# Patient Record
Sex: Female | Born: 1975 | Race: Black or African American | Hispanic: No | Marital: Single | Smoking: Never smoker
Health system: Southern US, Community
[De-identification: ages and names within clinical notes are randomized; demographics above are authoritative.]

## PROBLEM LIST (undated history)

## (undated) DIAGNOSIS — D509 Iron deficiency anemia, unspecified: Secondary | ICD-10-CM

## (undated) DIAGNOSIS — L309 Dermatitis, unspecified: Secondary | ICD-10-CM

## (undated) DIAGNOSIS — J40 Bronchitis, not specified as acute or chronic: Secondary | ICD-10-CM

## (undated) DIAGNOSIS — N921 Excessive and frequent menstruation with irregular cycle: Secondary | ICD-10-CM

## (undated) HISTORY — DX: Iron deficiency anemia, unspecified: D50.9

## (undated) HISTORY — PX: OTHER SURGICAL HISTORY: SHX169

## (undated) HISTORY — DX: Bronchitis, not specified as acute or chronic: J40

---

## 1995-11-29 HISTORY — PX: TUBAL LIGATION: SHX77

## 2011-04-14 ENCOUNTER — Emergency Department (HOSPITAL_COMMUNITY)
Admission: EM | Admit: 2011-04-14 | Discharge: 2011-04-15 | Disposition: A | Discharge status: Home / Self Care | Payer: Medicaid - Out of State | Attending: Emergency Medicine | Admitting: Emergency Medicine

## 2011-04-14 DIAGNOSIS — X58XXXA Exposure to other specified factors, initial encounter: Secondary | ICD-10-CM | POA: Insufficient documentation

## 2011-04-14 DIAGNOSIS — H10219 Acute toxic conjunctivitis, unspecified eye: Secondary | ICD-10-CM | POA: Insufficient documentation

## 2011-04-14 DIAGNOSIS — S0500XA Injury of conjunctiva and corneal abrasion without foreign body, unspecified eye, initial encounter: Secondary | ICD-10-CM | POA: Insufficient documentation

## 2011-07-11 ENCOUNTER — Ambulatory Visit (INDEPENDENT_AMBULATORY_CARE_PROVIDER_SITE_OTHER): Payer: Medicaid Other | Admitting: Family Medicine

## 2011-07-11 ENCOUNTER — Encounter: Payer: Self-pay | Admitting: Family Medicine

## 2011-07-11 VITALS — BP 118/80 | HR 62 | Resp 16 | Ht 64.5 in | Wt 177.1 lb

## 2011-07-11 DIAGNOSIS — Z720 Tobacco use: Secondary | ICD-10-CM

## 2011-07-11 DIAGNOSIS — E669 Obesity, unspecified: Secondary | ICD-10-CM | POA: Insufficient documentation

## 2011-07-11 DIAGNOSIS — Z3202 Encounter for pregnancy test, result negative: Secondary | ICD-10-CM

## 2011-07-11 DIAGNOSIS — Z309 Encounter for contraceptive management, unspecified: Secondary | ICD-10-CM

## 2011-07-11 DIAGNOSIS — D509 Iron deficiency anemia, unspecified: Secondary | ICD-10-CM

## 2011-07-11 DIAGNOSIS — E663 Overweight: Secondary | ICD-10-CM

## 2011-07-11 DIAGNOSIS — Z3049 Encounter for surveillance of other contraceptives: Secondary | ICD-10-CM

## 2011-07-11 DIAGNOSIS — F17200 Nicotine dependence, unspecified, uncomplicated: Secondary | ICD-10-CM

## 2011-07-11 MED ORDER — MEDROXYPROGESTERONE ACETATE 150 MG/ML IM SUSP
150.0000 mg | Freq: Once | INTRAMUSCULAR | Status: AC
  Administered 2011-07-11: 150 mg via INTRAMUSCULAR

## 2011-07-11 MED ORDER — BUPROPION HCL ER (SR) 150 MG PO TB12: 150.0000 mg | ORAL_TABLET | Freq: Two times a day (BID) | ORAL | Status: DC

## 2011-07-11 MED ORDER — FERROUS SULFATE 325 (65 FE) MG PO TBEC: 325.0000 mg | DELAYED_RELEASE_TABLET | Freq: Three times a day (TID) | ORAL | Status: DC

## 2011-07-11 NOTE — Progress Notes (Signed)
  Subjective:    Patient ID: Shelley Vargas, female    DOB: 01-31-1976, 35 y.o.   MRN: [id number]  HPI patient here to establish care. Medications and history was reviewed with patient. Her main concerns today are smoking and weight gain. She also needs a refill on her vitamins and Depo-Provera shot.  Smoking- smokes 1.5 ppd, [place] lights, started 4 months, feels stressed since she moved back to [place] from [place]. She has no prior history of smoking. 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 She would like to start medications to help smoking. She states she is ready to quit today. In the past she's tried the electronic cigarette.  Weight gain- 185lbs heaviest weight , feels fatigued when she walks, does not exercise on daily basis, feels like she is gaining weight since she has moved back to [place]. She does not pay particular attention to her diet.  Iron def anemia- started on supplement , levels were last  checked in May 2012.  Vitamin Deficiency- previously on q weekly supplment    Pt on Depo-Medrol for birth control PAP smear due in October LMP- 07/04/11  Previous PCP in [facility]-  Review of Systems GEN- +fatigue, denies fever, weight loss,weakness, recent illness CVS- denies chest pain, palpitations RESP- denies SOB, cough, wheeze MSK- denies joint pain, muscle aches, injury        Objective:   Physical Exam GEN- NAD, alert and oriented x3, overweight HEENT- PERRL, EOMI, non injected sclera, slightlt pale conjunctiva, MMM, oropharynx clear, no oral lesions Neck- Supple, no thryomegaly CVS- RRR, no murmur RESP-CTAB EXT- No edema, wellhealed scar on left lower leg  Pulses- Radial, DP- 2+        Assessment & Plan:

## 2011-07-11 NOTE — Assessment & Plan Note (Signed)
Discussed BMI of 29. Discussed healthy eating habits such as decreasing carbohydrates and fatty foods. Patient should also increase her exercise routine. See instructions. I advised she has not needed diet pill

## 2011-07-11 NOTE — Assessment & Plan Note (Addendum)
The patient is out of her on her supplement. Will give her a refill on her on tablets 325 mg 3 times a day. Records will be obtained from her previous PCP as well as the last set of labs. We also discussed other interventions such as the use of gum, carrots, and keeping herself busy.

## 2011-07-11 NOTE — Assessment & Plan Note (Signed)
Patient will like to start Zyban. Started 150 mg daily then increase to twice daily. Patient has her quit date for this week.

## 2011-07-11 NOTE — Patient Instructions (Addendum)
Restart your iron tablets For smoking start the Wellbutrin 150mg  once a day for 3 days, then take twice a day, take one dose in the morning and the second dose 8 hours later.  For your weight- Start a walking program 30 minutes a day at least 3 days a week Watch the fatty foods and carbohydrates- low carbs Depo shot due in 3 months-  We will get your medical records Schedule a visit for a PAP Smear in October

## 2011-07-24 ENCOUNTER — Encounter: Payer: Self-pay | Admitting: Family Medicine

## 2011-07-24 NOTE — Progress Notes (Signed)
  Subjective:    Patient ID: Shelley Vargas, female    DOB: [birth year], 35 y.o.   MRN: [id number]  HPI    Review of Systems     Objective:   Physical Exam        Assessment & Plan:   Received PCP records Labs 01/27/11  TSH- wnl TC 211, HDL 50 TG 133 LDL 134  BUN/CR- wnl  Vit D level- 8  Needs replacement if not done

## 2011-08-26 ENCOUNTER — Encounter: Payer: Self-pay | Admitting: Family Medicine

## 2011-08-26 ENCOUNTER — Ambulatory Visit: Payer: Medicaid Other | Admitting: Family Medicine

## 2011-10-11 ENCOUNTER — Ambulatory Visit: Payer: PRIVATE HEALTH INSURANCE

## 2012-02-17 ENCOUNTER — Emergency Department (HOSPITAL_COMMUNITY)
Admission: EM | Admit: 2012-02-17 | Discharge: 2012-02-17 | Disposition: A | Discharge status: Home / Self Care | Payer: Medicaid Other | Attending: Emergency Medicine | Admitting: Emergency Medicine

## 2012-02-17 ENCOUNTER — Encounter (HOSPITAL_COMMUNITY): Payer: Self-pay | Admitting: *Deleted

## 2012-02-17 DIAGNOSIS — Z207 Contact with and (suspected) exposure to pediculosis, acariasis and other infestations: Secondary | ICD-10-CM

## 2012-02-17 DIAGNOSIS — B86 Scabies: Secondary | ICD-10-CM | POA: Insufficient documentation

## 2012-02-17 DIAGNOSIS — Z2089 Contact with and (suspected) exposure to other communicable diseases: Secondary | ICD-10-CM

## 2012-02-17 DIAGNOSIS — Z760 Encounter for issue of repeat prescription: Secondary | ICD-10-CM | POA: Insufficient documentation

## 2012-02-17 DIAGNOSIS — D509 Iron deficiency anemia, unspecified: Secondary | ICD-10-CM | POA: Insufficient documentation

## 2012-02-17 HISTORY — DX: Dermatitis, unspecified: L30.9

## 2012-02-17 MED ORDER — PERMETHRIN 5 % EX CREA: TOPICAL_CREAM | CUTANEOUS | Status: AC

## 2012-02-17 MED ORDER — TRIAMCINOLONE ACETONIDE 0.1 % EX CREA: TOPICAL_CREAM | Freq: Two times a day (BID) | CUTANEOUS | Status: DC

## 2012-02-17 NOTE — ED Notes (Signed)
Rash, exposed to scabies.

## 2012-02-17 NOTE — ED Notes (Signed)
See EDP notes 

## 2012-02-18 NOTE — ED Provider Notes (Signed)
Medical screening examination/treatment/procedure(s) were performed by non-physician practitioner and as supervising physician I was immediately available for consultation/collaboration.  Brealynn Contino, MD 02/18/12 1616 

## 2012-02-18 NOTE — ED Provider Notes (Signed)
History     CSN: [id number]  Arrival date & time 02/17/12  1933   None     Chief Complaint  Patient presents with  . Rash    (Consider location/radiation/quality/duration/timing/severity/associated sxs/prior treatment) HPI Comments: Patient presents for treatment of scabies exposure.  She presents with 2 siblings and a nephew all of whom state at arrival to his home last weekend, only to discover that the relatives were diagnosed with scabies earlier this week.  She does have chronic patches of eczema, but no lesions or rash suspicious for scabies, but is desirous of treatment given his exposure.  She denies any symptoms at this time, specifically no fevers or chills, no rash(except for 2 small eczema-like patches on her lower back) she has recently moved here from [place] and has not established care with the family physician.  She also asks for refill of her triamcinolone for when necessary use for her eczema.  The history is provided by the patient.    Past Medical History  Diagnosis Date  . Bronchitis   . Iron deficiency anemia   . Eczema     Past Surgical History  Procedure Date  . Rods in left leg     Family History  Problem Relation Age of Onset  . Hypertension Mother   . Hypertension Father     History  Substance Use Topics  . Smoking status: Never Smoker   . Smokeless tobacco: Not on file  . Alcohol Use: No    OB History    Grav Para Term Preterm Abortions TAB SAB Ect Mult Living                  Review of Systems  Constitutional: Negative for fever.  HENT: Negative.  Negative for sore throat.   Eyes: Negative.   Respiratory: Negative for cough and shortness of breath.   Cardiovascular: Negative for chest pain.  Gastrointestinal: Negative for nausea and abdominal pain.  Genitourinary: Negative.   Musculoskeletal: Negative for joint swelling and arthralgias.  Skin: Positive for rash. Negative for wound.  Neurological: Negative for weakness.    Hematological: Negative.   Psychiatric/Behavioral: Negative.     Allergies  Penicillins  Home Medications   Current Outpatient Rx  Name Route Sig Dispense Refill  . BUPROPION HCL ER (SR) 150 MG PO TB12 Oral Take 1 tablet (150 mg total) by mouth 2 (two) times daily. 60 tablet 2  . FERROUS SULFATE 325 (65 FE) MG PO TBEC Oral Take 1 tablet (325 mg total) by mouth 3 (three) times daily with meals. 90 tablet 6  . PERMETHRIN 5 % EX CREA  Apply head to toes,  Avoiding eyes and leave on for 10 hours, then shower.  You may need to repeat this treatment if not better in 10 days. 60 g 1  . TRIAMCINOLONE ACETONIDE 0.1 % EX CREA Topical Apply topically 2 (two) times daily. 30 g 0    BP 133/87  Pulse 80  Temp(Src) 98 F (36.7 C) (Oral)  Resp 20  Ht 5\' 1"  (1.549 m)  Wt 185 lb (83.915 kg)  BMI 34.96 kg/m2  SpO2 100%  Physical Exam  Nursing note and vitals reviewed. Constitutional: She is oriented to person, place, and time. She appears well-developed and well-nourished.  HENT:  Head: Normocephalic and atraumatic.  Cardiovascular: Normal rate.   Pulmonary/Chest: Effort normal.  Musculoskeletal: Normal range of motion.  Neurological: She is alert and oriented to person, place, and time.  Skin: Skin is  warm and dry.       Several small macular patches on lower back which appear dry with flaking edges, consistent with eczema.  Skin is otherwise without rash or lesion.  Psychiatric: She has a normal mood and affect.    ED Course  Procedures (including critical care time)  Labs Reviewed - No data to display No results found.   1. Scabies exposure   2. Medication refill       MDM  Patient prescribed permethrin given exposure to scabies.  Also given a refill prescription on her triamcinolone for when necessary use.        Candis Musa, PA 02/18/12 1553

## 2012-05-16 ENCOUNTER — Ambulatory Visit (INDEPENDENT_AMBULATORY_CARE_PROVIDER_SITE_OTHER): Payer: Medicaid Other

## 2012-05-16 VITALS — BP 126/76 | Wt 182.0 lb

## 2012-05-16 DIAGNOSIS — Z111 Encounter for screening for respiratory tuberculosis: Secondary | ICD-10-CM

## 2012-05-16 NOTE — Progress Notes (Signed)
Pt in for PPD placement.  PPD placed in left forearm.  No sign or symptom of adverse reaction.  Pt instructed to return on Friday 05/18/2012 to have PPD read.

## 2012-05-18 LAB — TB SKIN TEST

## 2012-08-13 ENCOUNTER — Ambulatory Visit (INDEPENDENT_AMBULATORY_CARE_PROVIDER_SITE_OTHER): Payer: Medicaid Other

## 2012-08-13 VITALS — BP 124/72 | Wt 183.0 lb

## 2012-08-13 DIAGNOSIS — Z111 Encounter for screening for respiratory tuberculosis: Secondary | ICD-10-CM

## 2012-08-14 NOTE — Progress Notes (Signed)
Patient in for PPD placement.  Shot given in left forearm.  No sign or symptom of adverse reaction.  No voiced complaints.  Patient to return on Wednesday 9/18 to have PPD read.

## 2012-08-15 ENCOUNTER — Telehealth: Payer: Self-pay | Admitting: Family Medicine

## 2012-08-28 ENCOUNTER — Ambulatory Visit (INDEPENDENT_AMBULATORY_CARE_PROVIDER_SITE_OTHER): Payer: Medicaid Other | Admitting: Family Medicine

## 2012-08-28 ENCOUNTER — Encounter: Payer: Self-pay | Admitting: Family Medicine

## 2012-08-28 ENCOUNTER — Other Ambulatory Visit (HOSPITAL_COMMUNITY)
Admission: RE | Admit: 2012-08-28 | Discharge: 2012-08-28 | Disposition: A | Discharge status: Home / Self Care | Payer: Medicaid Other | Source: Ambulatory Visit | Attending: Family Medicine | Admitting: Family Medicine

## 2012-08-28 VITALS — BP 124/80 | HR 65 | Resp 15

## 2012-08-28 DIAGNOSIS — N760 Acute vaginitis: Secondary | ICD-10-CM | POA: Insufficient documentation

## 2012-08-28 DIAGNOSIS — N390 Urinary tract infection, site not specified: Secondary | ICD-10-CM

## 2012-08-28 DIAGNOSIS — Z113 Encounter for screening for infections with a predominantly sexual mode of transmission: Secondary | ICD-10-CM | POA: Insufficient documentation

## 2012-08-28 DIAGNOSIS — L259 Unspecified contact dermatitis, unspecified cause: Secondary | ICD-10-CM

## 2012-08-28 DIAGNOSIS — L309 Dermatitis, unspecified: Secondary | ICD-10-CM | POA: Insufficient documentation

## 2012-08-28 LAB — POCT URINALYSIS DIPSTICK
Protein, UA: NEGATIVE
Spec Grav, UA: 1.02
Urobilinogen, UA: 1
pH, UA: 8

## 2012-08-28 MED ORDER — TRIAMCINOLONE ACETONIDE 0.1 % EX CREA: TOPICAL_CREAM | Freq: Two times a day (BID) | CUTANEOUS | Status: DC

## 2012-08-28 MED ORDER — FLUCONAZOLE 150 MG PO TABS: 150.0000 mg | ORAL_TABLET | Freq: Once | ORAL | Status: AC

## 2012-08-28 NOTE — Progress Notes (Signed)
  Subjective:    Patient ID: Shelley Vargas, female    DOB: Apr 07, 1976, 36 y.o.   MRN: [id number]  HPI Patient will schedule for his physical exam however arrived 45 minutes late. She complained of vaginal irritation for the past 2 weeks. She denies any significant vaginal discharge. She denies dysuria or abdominal pain, fever. She has one sexual partner. She states that she used multiple different things are the past couple weeks that could contribute to her irritation. She used a vaginal spray, new soap, body wash in the vaginal area. She also used hand wash as she left her soap at work one day in the vaginal area. Her last menstrual period was 2 weeks ago and is regular She also noted after using handrail at work the past few month she has a rash that popped up on her hands which is itchy in nature. She denies any pustules or drainage Review of Systems - per above   GEN- denies fatigue, fever, weight loss,weakness, recent illness ABD- denies N/V, change in stools, abd pain GU- denies dysuria, hematuria, dribbling, incontinence, denies vaginal discharge       Objective:   Physical Exam GEN- NAD, alert and oriented x3  GU- normal external genitalia, vaginal mucosa mild irritation, cervix visualized no growth, no blood form os, + thick large amount of white discharge, no CMT, no ovarian masses, uterus normal size Skin- eczematous rash on left palm and wrist        Assessment & Plan:

## 2012-08-28 NOTE — Assessment & Plan Note (Signed)
Small area of dermatitis, will try TAC to skin BID, avoid gel washes

## 2012-08-28 NOTE — Addendum Note (Signed)
Addended by: Abner Greenspan on: 08/28/2012 12:48 PM   Modules accepted: Orders

## 2012-08-28 NOTE — Patient Instructions (Signed)
Take the fungal tablet Use the cream twice a day on hands and moisturize We will call with results Reschedule your PAP Smear

## 2012-08-28 NOTE — Assessment & Plan Note (Addendum)
Will give diflucan today, UA show signs of infection but she is asymptomatic therefore will hold on treatment of cystitis Cultures sent

## 2012-08-30 ENCOUNTER — Telehealth: Payer: Self-pay | Admitting: Family Medicine

## 2012-08-30 MED ORDER — CEPHALEXIN 500 MG PO CAPS: 500.0000 mg | ORAL_CAPSULE | Freq: Two times a day (BID) | ORAL | Status: AC

## 2012-08-30 MED ORDER — CLOTRIMAZOLE 1 % EX CREA: TOPICAL_CREAM | Freq: Two times a day (BID) | CUTANEOUS | Status: DC

## 2012-08-30 MED ORDER — HYDROCORTISONE 1 % EX CREA: TOPICAL_CREAM | CUTANEOUS | Status: DC

## 2012-08-30 NOTE — Telephone Encounter (Signed)
Message copied by Salley Scarlet on Thu Aug 30, 2012  4:14 PM ------      Message from: Abner Greenspan      Created: Thu Aug 30, 2012  4:13 PM       Still having itching and irritation. Wants something sent in to her pharmacy

## 2012-08-30 NOTE — Telephone Encounter (Signed)
Cultures negative, has mild dysuria now with irrtation Treat for uncomplicated cystitis and vaginitis external

## 2013-01-03 ENCOUNTER — Emergency Department (HOSPITAL_COMMUNITY)
Admission: EM | Admit: 2013-01-03 | Discharge: 2013-01-03 | Disposition: A | Discharge status: Home / Self Care | Payer: Medicaid Other | Attending: Emergency Medicine | Admitting: Emergency Medicine

## 2013-01-03 ENCOUNTER — Encounter (HOSPITAL_COMMUNITY): Payer: Self-pay

## 2013-01-03 ENCOUNTER — Emergency Department (HOSPITAL_COMMUNITY): Payer: Medicaid Other

## 2013-01-03 DIAGNOSIS — W230XXA Caught, crushed, jammed, or pinched between moving objects, initial encounter: Secondary | ICD-10-CM | POA: Insufficient documentation

## 2013-01-03 DIAGNOSIS — Z8709 Personal history of other diseases of the respiratory system: Secondary | ICD-10-CM | POA: Insufficient documentation

## 2013-01-03 DIAGNOSIS — Z872 Personal history of diseases of the skin and subcutaneous tissue: Secondary | ICD-10-CM | POA: Insufficient documentation

## 2013-01-03 DIAGNOSIS — Y9289 Other specified places as the place of occurrence of the external cause: Secondary | ICD-10-CM | POA: Insufficient documentation

## 2013-01-03 DIAGNOSIS — S9030XA Contusion of unspecified foot, initial encounter: Secondary | ICD-10-CM | POA: Insufficient documentation

## 2013-01-03 DIAGNOSIS — Z9889 Other specified postprocedural states: Secondary | ICD-10-CM | POA: Insufficient documentation

## 2013-01-03 DIAGNOSIS — S9032XA Contusion of left foot, initial encounter: Secondary | ICD-10-CM

## 2013-01-03 DIAGNOSIS — Z79899 Other long term (current) drug therapy: Secondary | ICD-10-CM | POA: Insufficient documentation

## 2013-01-03 DIAGNOSIS — Y939 Activity, unspecified: Secondary | ICD-10-CM | POA: Insufficient documentation

## 2013-01-03 DIAGNOSIS — Z862 Personal history of diseases of the blood and blood-forming organs and certain disorders involving the immune mechanism: Secondary | ICD-10-CM | POA: Insufficient documentation

## 2013-01-03 MED ORDER — IBUPROFEN 400 MG PO TABS
400.0000 mg | ORAL_TABLET | Freq: Once | ORAL | Status: AC
  Administered 2013-01-03: 400 mg via ORAL
  Filled 2013-01-03: qty 1

## 2013-01-03 MED ORDER — HYDROCODONE-ACETAMINOPHEN 5-325 MG PO TABS: 1.0000 | ORAL_TABLET | ORAL | Status: AC | PRN

## 2013-01-03 NOTE — ED Provider Notes (Signed)
History   This chart was scribed for Carleene Cooper III, MD, by Frederik Pear, ER scribe. The patient was seen in room APA03/APA03 and the patient's care was started at 0719.    CSN: [id number]  Arrival date & time 01/03/13  0612   First MD Initiated Contact with Patient 01/03/13 0719      Chief Complaint  Patient presents with  . Toe Injury    (Consider location/radiation/quality/duration/timing/severity/associated sxs/prior treatment) HPI  Shelley Vargas is a 37 y.o. female who presents to the Emergency Department complaining of gradually worsening, constant, moderate left fourth toe injury that is aggravated by walking and bearing weight and began last night after she jammed her foot into her bedroom door last night. She denies any treatment at home.    Past Medical History  Diagnosis Date  . Bronchitis   . Iron deficiency anemia   . Eczema     Past Surgical History  Procedure Date  . Rods in left leg     Family History  Problem Relation Age of Onset  . Hypertension Mother   . Hypertension Father     History  Substance Use Topics  . Smoking status: Never Smoker   . Smokeless tobacco: Not on file  . Alcohol Use: No    OB History    Grav Para Term Preterm Abortions TAB SAB Ect Mult Living                  Review of Systems  Musculoskeletal:       Toe injury  All other systems reviewed and are negative.    Allergies  Penicillins  Home Medications   Current Outpatient Rx  Name  Route  Sig  Dispense  Refill  . CLOTRIMAZOLE 1 % EX CREA   Topical   Apply topically 2 (two) times daily.   30 g   0   . HYDROCORTISONE 1 % EX CREA      Apply to affected area 2 times daily   30 g   1   . TRIAMCINOLONE ACETONIDE 0.1 % EX CREA   Topical   Apply topically 2 (two) times daily. Apply to hand   30 g   1     BP 137/85  Pulse 77  Temp 98.2 F (36.8 C) (Oral)  Resp 18  Ht 5\' 4"  (1.626 m)  Wt 185 lb (83.915 kg)  BMI 31.76 kg/m2  SpO2 98%  LMP  01/03/2013  Physical Exam  Nursing note and vitals reviewed. Constitutional: She is oriented to person, place, and time. She appears well-developed and well-nourished. No distress.  HENT:  Head: Normocephalic and atraumatic.  Eyes: EOM are normal.  Neck: Neck supple. No tracheal deviation present.  Cardiovascular: Normal rate.   Pulmonary/Chest: Effort normal. No respiratory distress.  Musculoskeletal: Normal range of motion.       Tenderness over the left distal forefoot in the are of the fourth and fifth metatarsals.There is no palpable deformity and full ROM. X-rays were negative.    Neurological: She is alert and oriented to person, place, and time.  Skin: Skin is warm and dry.  Psychiatric: She has a normal mood and affect. Her behavior is normal.    ED Course  Procedures (including critical care time)  DIAGNOSTIC STUDIES: Oxygen Saturation is 98% on room air, normal by my interpretation.    COORDINATION OF CARE:  07:23- Discussed planned course of treatment with the patient, including ibuprofen in the ED and an orthopedic shoe  and Vicodin at home, who is agreeable at this time.   Dg Foot Complete Left  01/03/2013  *RADIOLOGY REPORT*  Clinical Data: Foot injury fourth toe  LEFT FOOT - COMPLETE 3+ VIEW  Comparison: None  Findings: Negative for fracture.  No degenerative change or focal bony abnormality. Prior fixation of the distal tibia with a plate and screws.  IMPRESSION: No acute abnormality.   Original Report Authenticated By: Janeece Riggers, M.D.        1. Contusion of left foot     I personally performed the services described in this documentation, which was scribed in my presence. The recorded information has been reviewed and is accurate. Osvaldo Human, MD        Carleene Cooper III, MD 01/03/13 [phone]

## 2013-01-03 NOTE — ED Notes (Signed)
Pt hit her 4th toe of left foot on edge of door last night, states has been throbbing all night

## 2013-01-03 NOTE — Discharge Instructions (Signed)
Foot Contusion  A foot contusion is a deep bruise to the foot. Contusions are the result of an injury that caused bleeding under the skin. The contusion may turn blue, purple, or yellow. Minor injuries will give you a painless contusion, but more severe contusions may stay painful and swollen for a few weeks.  CAUSES   A foot contusion comes from a direct blow to that area, such as a heavy object falling on the foot.  SYMPTOMS    Swelling of the foot.   Discoloration of the foot.   Tenderness or soreness of the foot.  DIAGNOSIS   You will have a physical exam and will be asked about your history. You may need an X-ray of your foot to look for a broken bone (fracture).   TREATMENT   An elastic wrap may be recommended to support your foot. Resting, elevating, and applying cold compresses to your foot are often the best treatments for a foot contusion. Over-the-counter medicines may also be recommended for pain control.  HOME CARE INSTRUCTIONS    Put ice on the injured area.   Put ice in a plastic bag.   Place a towel between your skin and the bag.   Leave the ice on for 15 to 20 minutes, 3 to 4 times a day.   Only take over-the-counter or prescription medicines for pain, discomfort, or fever as directed by your caregiver.   If told, use an elastic wrap as directed. This can help reduce swelling. You may remove the wrap for sleeping, showering, and bathing. If your toes become numb, cold, or blue, take the wrap off and reapply it more loosely.   Elevate your foot with pillows to reduce swelling.   Try to avoid standing or walking while the foot is painful. Do not resume use until instructed by your caregiver. Then, begin use gradually. If pain develops, decrease use. Gradually increase activities that do not cause discomfort until you have normal use of your foot.   See your caregiver as directed. It is very important to keep all follow-up appointments in order to avoid any lasting problems with your foot,  including long-term (chronic) pain.  SEEK IMMEDIATE MEDICAL CARE IF:    You have increased redness, swelling, or pain in your foot.   Your swelling or pain is not relieved with medicines.   You have loss of feeling in your foot or are unable to move your toes.   Your foot turns cold or blue.   You have pain when you move your toes.   Your foot becomes warm to the touch.   Your contusion does not improve in 2 days.  MAKE SURE YOU:    Understand these instructions.   Will watch your condition.   Will get help right away if you are not doing well or get worse.  Document Released: 09/05/2006 Document Revised: 05/15/2012 Document Reviewed: 10/18/2011  ExitCare Patient Information 2013 [employer]

## 2013-01-18 ENCOUNTER — Ambulatory Visit: Payer: Medicaid Other | Admitting: Family Medicine

## 2013-05-28 ENCOUNTER — Encounter: Payer: Self-pay | Admitting: Family Medicine

## 2013-05-28 ENCOUNTER — Ambulatory Visit (INDEPENDENT_AMBULATORY_CARE_PROVIDER_SITE_OTHER): Payer: Medicaid Other | Admitting: Family Medicine

## 2013-05-28 VITALS — BP 110/70 | HR 70 | Temp 98.0°F | Resp 18 | Wt 176.0 lb

## 2013-05-28 DIAGNOSIS — L309 Dermatitis, unspecified: Secondary | ICD-10-CM | POA: Insufficient documentation

## 2013-05-28 DIAGNOSIS — N760 Acute vaginitis: Secondary | ICD-10-CM

## 2013-05-28 DIAGNOSIS — Z111 Encounter for screening for respiratory tuberculosis: Secondary | ICD-10-CM

## 2013-05-28 DIAGNOSIS — E663 Overweight: Secondary | ICD-10-CM

## 2013-05-28 DIAGNOSIS — L259 Unspecified contact dermatitis, unspecified cause: Secondary | ICD-10-CM

## 2013-05-28 MED ORDER — CLOBETASOL PROPIONATE 0.05 % EX OINT: TOPICAL_OINTMENT | Freq: Two times a day (BID) | CUTANEOUS | Status: DC

## 2013-05-28 NOTE — Assessment & Plan Note (Signed)
Losing weight, trying to be more active, congratulated on weight loss

## 2013-05-28 NOTE — Progress Notes (Signed)
  Subjective:    Patient ID: Shelley Vargas, female    DOB: 05-21-1976, 37 y.o.   MRN: [id number]  HPI  Patient here initially for physical exam however upon arriving found out that she had her physical exam done on February 28, 2013 at the [facility] Department she also had blood work checked including her cholesterol her Medicaid was inactive for some time. She does complain of eczematous rash on her arms her foot in her hands she was using triamcinolone and hydrocortisone however these did not help. She is also been using Shea butter for moisturizing. 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  Review of Systems - PER ABOVE  GEN- denies fatigue, fever, weight loss,weakness, recent illness HEENT- denies eye drainage, change in vision, nasal discharge, CVS- denies chest pain, palpitations RESP- denies SOB, cough, wheeze Skin- + rash Neuro- denies headache, dizziness, syncope, seizure activity       Objective:   Physical Exam GEN- NAD, alert and oriented x3 Skin- ezematous patch on left upper arm and antecubital fossa, small patch on left hand and right medial foot, no papules, no vesicles no erythema, dry skin on back          Assessment & Plan:

## 2013-05-28 NOTE — Patient Instructions (Addendum)
Come back Thursday for PPD I will get the records from health Dept- [facility] - need PAP SMEAR, last OV and last set of labs Start the new cream twice a day Complete the flagyl  F/U as needed or in 1 year for physical

## 2013-05-28 NOTE — Assessment & Plan Note (Signed)
Complete last day for BV I will obtain last PAP Smear and set of labs

## 2013-05-28 NOTE — Assessment & Plan Note (Signed)
Trial of clobestasol, TAC did not help very much, moisturize

## 2013-05-30 ENCOUNTER — Ambulatory Visit: Payer: Medicaid Other | Admitting: *Deleted

## 2013-10-22 ENCOUNTER — Emergency Department (HOSPITAL_COMMUNITY): Payer: Medicaid Other

## 2013-10-22 ENCOUNTER — Emergency Department (HOSPITAL_COMMUNITY)
Admission: EM | Admit: 2013-10-22 | Discharge: 2013-10-22 | Disposition: A | Discharge status: Home / Self Care | Payer: Medicaid Other | Attending: Emergency Medicine | Admitting: Emergency Medicine

## 2013-10-22 ENCOUNTER — Encounter (HOSPITAL_COMMUNITY): Payer: Self-pay | Admitting: Emergency Medicine

## 2013-10-22 DIAGNOSIS — J209 Acute bronchitis, unspecified: Secondary | ICD-10-CM

## 2013-10-22 DIAGNOSIS — Z880 Allergy status to penicillin: Secondary | ICD-10-CM | POA: Insufficient documentation

## 2013-10-22 DIAGNOSIS — Z79899 Other long term (current) drug therapy: Secondary | ICD-10-CM | POA: Insufficient documentation

## 2013-10-22 DIAGNOSIS — Z862 Personal history of diseases of the blood and blood-forming organs and certain disorders involving the immune mechanism: Secondary | ICD-10-CM | POA: Insufficient documentation

## 2013-10-22 DIAGNOSIS — Z872 Personal history of diseases of the skin and subcutaneous tissue: Secondary | ICD-10-CM | POA: Insufficient documentation

## 2013-10-22 MED ORDER — PROMETHAZINE-CODEINE 6.25-10 MG/5ML PO SYRP: 5.0000 mL | ORAL_SOLUTION | ORAL | Status: DC | PRN

## 2013-10-22 MED ORDER — BENZONATATE 100 MG PO CAPS
200.0000 mg | ORAL_CAPSULE | Freq: Once | ORAL | Status: AC
  Administered 2013-10-22: 200 mg via ORAL
  Filled 2013-10-22: qty 2

## 2013-10-22 MED ORDER — BENZONATATE 100 MG PO CAPS: 100.0000 mg | ORAL_CAPSULE | Freq: Three times a day (TID) | ORAL | Status: DC

## 2013-10-22 MED ORDER — ALBUTEROL SULFATE HFA 108 (90 BASE) MCG/ACT IN AERS: 2.0000 | INHALATION_SPRAY | RESPIRATORY_TRACT | Status: DC | PRN

## 2013-10-22 NOTE — ED Notes (Signed)
Pt takes care of patients. Has had cough nonprod x 3 weeks. Sore throat. Pain to abd and chest "from coughing", pain worse with  Coughing. Nad. No resp distress. Constant dry cough noted.

## 2013-10-24 LAB — CULTURE, GROUP A STREP

## 2013-10-24 NOTE — ED Provider Notes (Signed)
CSN: [id number]     Arrival date & time 10/22/13  0941 History   First MD Initiated Contact with Patient 10/22/13 1018     Chief Complaint  Patient presents with  . Cough   (Consider location/radiation/quality/duration/timing/severity/associated sxs/prior Treatment) Patient is a 37 y.o. female presenting with cough. The history is provided by the patient.  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  He denies sob.) Ineffective treatments:  Beta-agonist inhaler (states is old,  not effective.  Denies history of asthma,  given for last episode of bronchitis.) Associated symptoms: sore throat and wheezing   Associated symptoms: no chest pain, no chills, no ear pain, no fever, no headaches, no myalgias, no rash, no rhinorrhea, no shortness of breath and no sinus congestion   Associated symptoms comment:  He reports soreness from his throat to his abdomen from frequent cough,  Only present when he is coughing.   Past Medical History  Diagnosis Date  . Bronchitis   . Iron deficiency anemia   . Eczema   . Bronchitis    Past Surgical History  Procedure Laterality Date  . Rods in left leg     Family History  Problem Relation Age of Onset  . Hypertension Mother   . Hypertension Father    History  Substance Use Topics  . Smoking status: Never Smoker   . Smokeless tobacco: Not on file  . Alcohol Use: No   OB History   Grav Para Term Preterm Abortions TAB SAB Ect Mult Living                 Review of Systems  Constitutional: Negative for fever and chills.  HENT: Positive for sore throat. Negative for congestion, ear pain,  postnasal drip, rhinorrhea and sinus pressure.   Eyes: Negative.   Respiratory: Positive for cough and wheezing. Negative for chest tightness and shortness of breath.   Cardiovascular: Negative for chest pain.  Gastrointestinal: Negative for nausea and abdominal pain.  Genitourinary: Negative.   Musculoskeletal: Negative for arthralgias, joint swelling, myalgias and neck pain.  Skin: Negative.  Negative for rash and wound.  Neurological: Negative for dizziness, weakness, light-headedness, numbness and headaches.  Psychiatric/Behavioral: Negative.     Allergies  Penicillins  Home Medications   Current Outpatient Rx  Name  Route  Sig  Dispense  Refill  . albuterol (PROAIR HFA) 108 (90 BASE) MCG/ACT inhaler   Inhalation   Inhale 2 puffs into the lungs every 6 (six) hours as needed for wheezing or shortness of breath.         Marland Kitchen albuterol (PROVENTIL HFA;VENTOLIN HFA) 108 (90 BASE) MCG/ACT inhaler   Inhalation   Inhale 2 puffs into the lungs every 4 (four) hours as needed for wheezing or shortness of breath.   1 Inhaler   0   . benzonatate (TESSALON) 100 MG capsule   Oral   Take 1 capsule (100 mg total) by mouth every 8 (eight) hours.   21 capsule   0   . promethazine-codeine (PHENERGAN WITH CODEINE) 6.25-10 MG/5ML syrup   Oral   Take 5 mLs by mouth every 4 (four) hours as needed for cough.  120 mL   0    BP 139/93  Pulse 60  Temp(Src) 98.7 F (37.1 C) (Oral)  Resp 18  SpO2 100%  LMP 10/19/2013 Physical Exam  Constitutional: She is oriented to person, place, and time. She appears well-developed and well-nourished.  HENT:  Head: Normocephalic and atraumatic.  Right Ear: Tympanic membrane and ear canal normal.  Left Ear: Tympanic membrane and ear canal normal.  Nose: No mucosal edema or rhinorrhea.  Mouth/Throat: Uvula is midline, oropharynx is clear and moist and mucous membranes are normal. No oropharyngeal exudate, posterior oropharyngeal edema, posterior  oropharyngeal erythema or tonsillar abscesses.  Eyes: Conjunctivae are normal.  Neck: Normal range of motion. Neck supple.  Cardiovascular: Normal rate and normal heart sounds.   Pulmonary/Chest: Effort normal. No respiratory distress. She has no wheezes. She has no rales. She exhibits no tenderness.  frfequent dry cough during exam. No active wheezing at present.  Adequate aeration with inspiration.  Abdominal: Soft. There is no tenderness.  Musculoskeletal: Normal range of motion.  Neurological: She is alert and oriented to person, place, and time.  Skin: Skin is warm and dry. No rash noted.  Psychiatric: She has a normal mood and affect.    ED Course  Procedures (including critical care time) Labs Review Labs Reviewed  RAPID STREP SCREEN  CULTURE, GROUP A STREP   Imaging Review No results found.  EKG Interpretation   None       MDM   1. Bronchitis, acute    Patients labs and/or radiological studies were viewed and considered during the medical decision making and disposition process.  Pt prescribed albuterol mdi,  Phenergan with codeine, tessalon for daytime use.  Encouraged rest,  Increased fluid intake.  F/u with pcp if sx not improved over the next week.  The patient appears reasonably screened and/or stabilized for discharge and I doubt any other medical condition or other [facility] requiring further screening, evaluation, or treatment in the ED at this time prior to discharge.     Burgess Amor, PA-C 10/24/13 1225

## 2013-10-25 NOTE — ED Provider Notes (Signed)
Medical screening examination/treatment/procedure(s) were performed by non-physician practitioner and as supervising physician I was immediately available for consultation/collaboration.  EKG Interpretation   None         Jessee Newnam M Deem Marmol, DO 10/25/13 1041 

## 2014-01-02 ENCOUNTER — Ambulatory Visit: Payer: Medicaid Other | Admitting: Physician Assistant

## 2014-01-07 ENCOUNTER — Ambulatory Visit (INDEPENDENT_AMBULATORY_CARE_PROVIDER_SITE_OTHER): Payer: Medicaid Other | Admitting: Family Medicine

## 2014-01-07 ENCOUNTER — Encounter: Payer: Self-pay | Admitting: Family Medicine

## 2014-01-07 VITALS — BP 120/80 | HR 78 | Temp 98.4°F | Resp 18 | Ht 64.0 in | Wt 189.0 lb

## 2014-01-07 DIAGNOSIS — L309 Dermatitis, unspecified: Secondary | ICD-10-CM

## 2014-01-07 DIAGNOSIS — L538 Other specified erythematous conditions: Secondary | ICD-10-CM

## 2014-01-07 DIAGNOSIS — N390 Urinary tract infection, site not specified: Secondary | ICD-10-CM

## 2014-01-07 DIAGNOSIS — L304 Erythema intertrigo: Secondary | ICD-10-CM | POA: Insufficient documentation

## 2014-01-07 DIAGNOSIS — L259 Unspecified contact dermatitis, unspecified cause: Secondary | ICD-10-CM

## 2014-01-07 DIAGNOSIS — L910 Hypertrophic scar: Secondary | ICD-10-CM | POA: Insufficient documentation

## 2014-01-07 LAB — URINALYSIS, ROUTINE W REFLEX MICROSCOPIC
Bilirubin Urine: NEGATIVE
GLUCOSE, UA: NEGATIVE mg/dL
KETONES UR: NEGATIVE mg/dL
Leukocytes, UA: NEGATIVE
NITRITE: NEGATIVE
PH: 7 (ref 5.0–8.0)
Protein, ur: NEGATIVE mg/dL
SPECIFIC GRAVITY, URINE: 1.02 (ref 1.005–1.030)
Urobilinogen, UA: 2 mg/dL — ABNORMAL HIGH (ref 0.0–1.0)

## 2014-01-07 LAB — URINALYSIS, MICROSCOPIC ONLY
CASTS: NONE SEEN
CRYSTALS: NONE SEEN

## 2014-01-07 MED ORDER — TRIAMCINOLONE ACETONIDE 0.1 % EX CREA: 1.0000 "application " | TOPICAL_CREAM | Freq: Two times a day (BID) | CUTANEOUS | Status: DC

## 2014-01-07 MED ORDER — CLOTRIMAZOLE 1 % EX CREA: 1.0000 "application " | TOPICAL_CREAM | Freq: Two times a day (BID) | CUTANEOUS | Status: DC

## 2014-01-07 MED ORDER — FLUCONAZOLE 150 MG PO TABS: 150.0000 mg | ORAL_TABLET | Freq: Once | ORAL | Status: DC

## 2014-01-07 NOTE — Assessment & Plan Note (Signed)
Referral to derm for removal

## 2014-01-07 NOTE — Progress Notes (Signed)
Patient ID: Shelley Vargas, female   DOB: August 26, 1976, 38 y.o.   MRN: [id number]   Subjective:    Patient ID: Shelley NeatAronzo Lagrow, female    DOB: August 26, 1976, 38 y.o.   MRN: [id number]  Patient presents for Rash and Urinary Tract Infection  patient here secondary to rash. [facility] to some vaginal discharge but was told that her wet prep was normal in that she had a urinary tract infection. She was placed on Macrobid on January 23 and her symptoms cleared up however a few days later she began to have redness and itching in her groin region as well as her labia. She states she feels very irritation but has not had any vaginal discharge. She's not used any new soap or lotion recently though she did use some type of an inch body wash a couple weeks ago. She also requested refill on her eczema cream  She also requested referral to dermatology due to a  keloid they came up on her right earlobe after wearing some hearing.    Review Of Systems: - per above  GEN- denies fatigue, fever, weight loss,weakness, recent illness ABD- denies N/V, change in stools, abd pain GU- denies dysuria, hematuria, dribbling, incontinence Neuro- denies headache, dizziness, syncope, seizure activity       Objective:    BP 120/80  Pulse 78  Temp(Src) 98.4 F (36.9 C) (Oral)  Resp 18  Ht 5\' 4"  (1.626 m)  Wt 189 lb (85.73 kg)  BMI 32.43 kg/m2 GEN- NAD, alert and oriented x3 Skin- Right ear lobe small keloid, no erythema   Bilat groin and labia majora, erythema, no papules no vesicles, dry skin noted    Bilat hands- eczematous rash hands and lower back        Assessment & Plan:      Problem List Items Addressed This Visit   None    Visit Diagnoses   UTI (urinary tract infection)    -  Primary    Relevant Medications       clotrimazole (LOTRIMIN) 1 % cream       fluconazole (DIFLUCAN) tablet 150 mg    Other Relevant Orders       Urinalysis, Routine w  reflex microscopic (Completed)       Note: This dictation was prepared with Dragon dictation along with smaller phrase technology. Any transcriptional errors that result from this process are unintentional.

## 2014-01-07 NOTE — Assessment & Plan Note (Signed)
TAC refilled

## 2014-01-07 NOTE — Assessment & Plan Note (Signed)
I think this is of vaginal irritation from these especially coming up after the antibiotics are the body wash but also made her sensitive. I will give her a Diflucan pill to take as well as topical clotrimazole she can use a small amount of triamcinolone on top of this for the severe itching for the next week

## 2014-01-07 NOTE — Patient Instructions (Addendum)
Use the clotrimazole twice a day for yeast Triamcinolone helps the itching, dont put inside the vaginal area  Take diflucan pill x 1 dose Referral to dermatology F/U 1 months For PHYSICAL with PAP

## 2014-01-09 ENCOUNTER — Encounter: Payer: Self-pay | Admitting: *Deleted

## 2014-01-10 ENCOUNTER — Encounter: Payer: Self-pay | Admitting: *Deleted

## 2014-01-27 ENCOUNTER — Telehealth: Payer: Self-pay | Admitting: Family Medicine

## 2014-01-27 MED ORDER — FLUCONAZOLE 150 MG PO TABS: 150.0000 mg | ORAL_TABLET | Freq: Once | ORAL | Status: DC

## 2014-01-27 NOTE — Telephone Encounter (Signed)
Call back number is [phone] PT is needing a refill on her yeast infection medication Pharmacy [place] [facility] [place]

## 2014-01-27 NOTE — Telephone Encounter (Signed)
Call placed to patient and patient made aware.  

## 2014-01-27 NOTE — Telephone Encounter (Signed)
Okay to refill? 

## 2014-01-27 NOTE — Telephone Encounter (Signed)
Call placed to patient and she is stating that itching and discharge continue.   Ok to refill??

## 2014-02-04 ENCOUNTER — Ambulatory Visit: Payer: Medicaid Other | Admitting: Family Medicine

## 2014-04-25 ENCOUNTER — Emergency Department (HOSPITAL_COMMUNITY)
Admission: EM | Admit: 2014-04-25 | Discharge: 2014-04-25 | Disposition: A | Discharge status: Home / Self Care | Payer: Medicaid Other | Attending: Emergency Medicine | Admitting: Emergency Medicine

## 2014-04-25 ENCOUNTER — Emergency Department (HOSPITAL_COMMUNITY): Payer: Medicaid Other

## 2014-04-25 ENCOUNTER — Encounter (HOSPITAL_COMMUNITY): Payer: Self-pay | Admitting: Emergency Medicine

## 2014-04-25 DIAGNOSIS — Z880 Allergy status to penicillin: Secondary | ICD-10-CM | POA: Insufficient documentation

## 2014-04-25 DIAGNOSIS — Z79899 Other long term (current) drug therapy: Secondary | ICD-10-CM | POA: Insufficient documentation

## 2014-04-25 DIAGNOSIS — Z872 Personal history of diseases of the skin and subcutaneous tissue: Secondary | ICD-10-CM | POA: Insufficient documentation

## 2014-04-25 DIAGNOSIS — Z862 Personal history of diseases of the blood and blood-forming organs and certain disorders involving the immune mechanism: Secondary | ICD-10-CM | POA: Insufficient documentation

## 2014-04-25 DIAGNOSIS — Z791 Long term (current) use of non-steroidal anti-inflammatories (NSAID): Secondary | ICD-10-CM | POA: Insufficient documentation

## 2014-04-25 DIAGNOSIS — J209 Acute bronchitis, unspecified: Secondary | ICD-10-CM | POA: Insufficient documentation

## 2014-04-25 DIAGNOSIS — R42 Dizziness and giddiness: Secondary | ICD-10-CM | POA: Insufficient documentation

## 2014-04-25 MED ORDER — BENZONATATE 100 MG PO CAPS: 200.0000 mg | ORAL_CAPSULE | Freq: Three times a day (TID) | ORAL | Status: DC | PRN

## 2014-04-25 MED ORDER — BENZONATATE 100 MG PO CAPS
200.0000 mg | ORAL_CAPSULE | Freq: Once | ORAL | Status: AC
Start: 2014-04-25 — End: 2014-04-25
  Administered 2014-04-25: 200 mg via ORAL
  Filled 2014-04-25: qty 2

## 2014-04-25 MED ORDER — PREDNISONE 10 MG PO TABS: ORAL_TABLET | ORAL | Status: DC

## 2014-04-25 MED ORDER — PREDNISONE 10 MG PO TABS
60.0000 mg | ORAL_TABLET | Freq: Once | ORAL | Status: AC
  Administered 2014-04-25: 60 mg via ORAL
  Filled 2014-04-25 (×2): qty 1

## 2014-04-25 NOTE — ED Notes (Signed)
Cough for 5 days, felt lightheaded, used inhaler today, no relief

## 2014-04-25 NOTE — Discharge Instructions (Signed)
Acute Bronchitis Bronchitis is inflammation of the airways that extend from the windpipe into the lungs (bronchi). The inflammation often causes mucus to develop. This leads to a cough, which is the most common symptom of bronchitis.  In acute bronchitis, the condition usually develops suddenly and goes away over time, usually in a couple weeks. Smoking, allergies, and asthma can make bronchitis worse. Repeated episodes of bronchitis may cause further lung problems.  CAUSES Acute bronchitis is most often caused by the same virus that causes a cold. The virus can spread from person to person (contagious).  SIGNS AND SYMPTOMS   Cough.   Fever.   Coughing up mucus.   Body aches.   Chest congestion.   Chills.   Shortness of breath.   Sore throat.  DIAGNOSIS  Acute bronchitis is usually diagnosed through a physical exam. Tests, such as chest X-rays, are sometimes done to rule out other conditions.  TREATMENT  Acute bronchitis usually goes away in a couple weeks. Often times, no medical treatment is necessary. Medicines are sometimes given for relief of fever or cough. Antibiotics are usually not needed but may be prescribed in certain situations. In some cases, an inhaler may be recommended to help reduce shortness of breath and control the cough. A cool mist vaporizer may also be used to help thin bronchial secretions and make it easier to clear the chest.  HOME CARE INSTRUCTIONS  Get plenty of rest.   Drink enough fluids to keep your urine clear or pale yellow (unless you have a medical condition that requires fluid restriction). Increasing fluids may help thin your secretions and will prevent dehydration.   Only take over-the-counter or prescription medicines as directed by your health care provider.   Avoid smoking and secondhand smoke. Exposure to cigarette smoke or irritating chemicals will make bronchitis worse. If you are a smoker, consider using nicotine gum or skin  patches to help control withdrawal symptoms. Quitting smoking will help your lungs heal faster.   Reduce the chances of another bout of acute bronchitis by washing your hands frequently, avoiding people with cold symptoms, and trying not to touch your hands to your mouth, nose, or eyes.   Follow up with your health care provider as directed.  SEEK MEDICAL CARE IF: Your symptoms do not improve after 1 week of treatment.  SEEK IMMEDIATE MEDICAL CARE IF:  You develop an increased fever or chills.   You have chest pain.   You have severe shortness of breath.  You have bloody sputum.   You develop dehydration.  You develop fainting.  You develop repeated vomiting.  You develop a severe headache. MAKE SURE YOU:   Understand these instructions.  Will watch your condition.  Will get help right away if you are not doing well or get worse. Document Released: 12/22/2004 Document Revised: 07/17/2013 Document Reviewed: 05/07/2013 [facility] Patient Information 2014 [place], [place].   Take your next dose of prednisone tomorrow evening.  You may use your inhaler, 2 puffs every 4 hours if you are wheezing or short of breath.  Your chest xray is negative for pneumonia, but your symptoms are suspicious for acute bronchitis.

## 2014-04-27 NOTE — ED Provider Notes (Signed)
Medical screening examination/treatment/procedure(s) were performed by non-physician practitioner and as supervising physician I was immediately available for consultation/collaboration.   EKG Interpretation None        Joya Gaskins, MD 04/27/14 [phone]

## 2014-04-27 NOTE — ED Provider Notes (Signed)
CSN: [id number]     Arrival date & time 04/25/14  1606 History   First MD Initiated Contact with Patient 04/25/14 1654     Chief Complaint  Patient presents with  . Cough     (Consider location/radiation/quality/duration/timing/severity/associated sxs/prior Treatment) HPI Comments: Shelley Vargas is a 38 y.o. Female 5 day history of uri type symptoms which includes clear rhinorrhea, sore throat (which she blames on the frequency of coughing), wheezing and persistent nonproductive cough.  Symptoms due to not include shortness of breath, chest pain,  Nausea, vomiting or diarrhea.  The patient has taken her albuterol inhaler (which she was previously given here for an episode of bronchitis) prior to arrival with no significant improvement in symptoms. She also describes feeling lightheaded earlier today with coughing spells.  She is not a smoker.      The history is provided by the patient.    Past Medical History  Diagnosis Date  . Bronchitis   . Iron deficiency anemia   . Eczema   . Bronchitis    Past Surgical History  Procedure Laterality Date  . Rods in left leg     Family History  Problem Relation Age of Onset  . Hypertension Mother   . Hypertension Father    History  Substance Use Topics  . Smoking status: Never Smoker   . Smokeless tobacco: Not on file  . Alcohol Use: 0.6 oz/week    1 Glasses of wine per week     Comment: occasioanl   OB History   Grav Para Term Preterm Abortions TAB SAB Ect Mult Living                 Review of Systems  Constitutional: Negative for fever.  HENT: Positive for sore throat. Negative for congestion.   Eyes: Negative.   Respiratory: Positive for cough and wheezing. Negative for chest tightness and shortness of breath.   Cardiovascular: Negative for chest pain.  Gastrointestinal: Negative for nausea and abdominal pain.  Genitourinary: Negative.   Musculoskeletal: Negative for arthralgias, joint swelling and neck pain.  Skin:  Negative.  Negative for rash and wound.  Neurological: Positive for light-headedness. Negative for dizziness, weakness, numbness and headaches.  Psychiatric/Behavioral: Negative.       Allergies  Penicillins  Home Medications   Prior to Admission medications   Medication Sig Start Date End Date Taking? Authorizing Provider  albuterol (PROVENTIL HFA;VENTOLIN HFA) 108 (90 BASE) MCG/ACT inhaler Inhale 2 puffs into the lungs every 4 (four) hours as needed for wheezing or shortness of breath. 10/22/13  Yes Raynelle Fanning Neomi Laidler, PA-C  ibuprofen (ADVIL,MOTRIN) 200 MG tablet Take 200-400 mg by mouth daily as needed for mild pain or moderate pain.   Yes Historical Provider, MD  benzonatate (TESSALON) 100 MG capsule Take 2 capsules (200 mg total) by mouth 3 (three) times daily as needed for cough. 04/25/14   Burgess Amor, PA-C  predniSONE (DELTASONE) 10 MG tablet 6, 5, 4, 3, 2 then 1 tablet by mouth daily for 6 days total. 04/25/14   Burgess Amor, PA-C   BP 133/102  Pulse 79  Temp(Src) 98.9 F (37.2 C) (Oral)  Resp 16  Ht 5\' 4"  (1.626 m)  Wt 190 lb (86.183 kg)  BMI 32.60 kg/m2  SpO2 100%  LMP 03/26/2014 Physical Exam  Constitutional: She is oriented to person, place, and time. She appears well-developed and well-nourished.  HENT:  Head: Normocephalic and atraumatic.  Right Ear: Tympanic membrane and ear canal normal.  Left Ear:  Tympanic membrane and ear canal normal.  Nose: Mucosal edema and rhinorrhea present.  Mouth/Throat: Uvula is midline, oropharynx is clear and moist and mucous membranes are normal. No oropharyngeal exudate, posterior oropharyngeal edema, posterior oropharyngeal erythema or tonsillar abscesses.  Eyes: Conjunctivae are normal.  Cardiovascular: Normal rate and normal heart sounds.   Pulmonary/Chest: Effort normal. No respiratory distress. She has no decreased breath sounds. She has no wheezes. She has no rhonchi. She has no rales.  Frequent dry cough, no wheezing at present.   Abdominal: Soft. There is no tenderness.  Musculoskeletal: Normal range of motion. She exhibits no edema.  Neurological: She is alert and oriented to person, place, and time.  Skin: Skin is warm and dry. No rash noted.  Psychiatric: She has a normal mood and affect.    ED Course  Procedures (including critical care time) Labs Review Labs Reviewed - No data to display  Imaging Review Dg Chest 2 View  04/25/2014   CLINICAL DATA:  Chest pain with nonproductive cough.  EXAM: CHEST  2 VIEW  COMPARISON:  Chest radiograph October 22, 2013.  FINDINGS: Cardiomediastinal silhouette is unremarkable. The lungs are clear without pleural effusions or focal consolidations. Trachea projects midline and there is no pneumothorax. Soft tissue planes and included osseous structures are non-suspicious.  IMPRESSION: No acute cardiopulmonary process, stable appearance of the chest from October 22, 2013.   Electronically Signed   By: Awilda Metroourtnay  Bloomer   On: 04/25/2014 17:42     EKG Interpretation None      MDM   Final diagnoses:  Bronchitis, acute    Pt with normal exam including normal orthostatic VS.  History c/w bronchitis.  She was given tessalon, prednisone,  Encouraged to continue using her albuterol mdi q 4 hours for sx of wheezing or cough.  F/u with pcp this week if sx persist.  The patient appears reasonably screened and/or stabilized for discharge and I doubt any other medical condition or other [facility] requiring further screening, evaluation, or treatment in the ED at this time prior to discharge.  Patients labs and/or radiological studies were viewed and considered during the medical decision making and disposition process.     Burgess AmorJulie Shiela Bruns, PA-C 04/27/14 1312

## 2014-06-09 ENCOUNTER — Ambulatory Visit: Payer: Medicaid Other | Admitting: Family Medicine

## 2014-06-13 ENCOUNTER — Other Ambulatory Visit: Payer: Self-pay | Admitting: Family Medicine

## 2014-06-13 ENCOUNTER — Ambulatory Visit (INDEPENDENT_AMBULATORY_CARE_PROVIDER_SITE_OTHER): Payer: Medicaid Other | Admitting: Family Medicine

## 2014-06-13 ENCOUNTER — Encounter: Payer: Self-pay | Admitting: Family Medicine

## 2014-06-13 VITALS — BP 122/68 | HR 64 | Temp 98.4°F | Resp 14 | Ht 65.5 in | Wt 194.0 lb

## 2014-06-13 DIAGNOSIS — N760 Acute vaginitis: Secondary | ICD-10-CM

## 2014-06-13 DIAGNOSIS — Z124 Encounter for screening for malignant neoplasm of cervix: Secondary | ICD-10-CM

## 2014-06-13 DIAGNOSIS — L309 Dermatitis, unspecified: Secondary | ICD-10-CM

## 2014-06-13 DIAGNOSIS — Z0000 Encounter for general adult medical examination without abnormal findings: Secondary | ICD-10-CM

## 2014-06-13 DIAGNOSIS — Z20828 Contact with and (suspected) exposure to other viral communicable diseases: Secondary | ICD-10-CM

## 2014-06-13 DIAGNOSIS — L259 Unspecified contact dermatitis, unspecified cause: Secondary | ICD-10-CM

## 2014-06-13 DIAGNOSIS — Z1231 Encounter for screening mammogram for malignant neoplasm of breast: Secondary | ICD-10-CM

## 2014-06-13 DIAGNOSIS — R350 Frequency of micturition: Secondary | ICD-10-CM

## 2014-06-13 DIAGNOSIS — Z1211 Encounter for screening for malignant neoplasm of colon: Secondary | ICD-10-CM | POA: Insufficient documentation

## 2014-06-13 LAB — CBC WITH DIFFERENTIAL/PLATELET
BASOS PCT: 1 % (ref 0–1)
Basophils Absolute: 0.1 10*3/uL (ref 0.0–0.1)
EOS ABS: 0.1 10*3/uL (ref 0.0–0.7)
EOS PCT: 1 % (ref 0–5)
HEMATOCRIT: 37.9 % (ref 36.0–46.0)
HEMOGLOBIN: 12.9 g/dL (ref 12.0–15.0)
Lymphocytes Relative: 27 % (ref 12–46)
Lymphs Abs: 1.7 10*3/uL (ref 0.7–4.0)
MCH: 28.8 pg (ref 26.0–34.0)
MCHC: 34 g/dL (ref 30.0–36.0)
MCV: 84.6 fL (ref 78.0–100.0)
MONO ABS: 0.4 10*3/uL (ref 0.1–1.0)
MONOS PCT: 7 % (ref 3–12)
Neutro Abs: 4 10*3/uL (ref 1.7–7.7)
Neutrophils Relative %: 64 % (ref 43–77)
Platelets: 250 10*3/uL (ref 150–400)
RBC: 4.48 MIL/uL (ref 3.87–5.11)
RDW: 12.9 % (ref 11.5–15.5)
WBC: 6.3 10*3/uL (ref 4.0–10.5)

## 2014-06-13 LAB — URINALYSIS, ROUTINE W REFLEX MICROSCOPIC
Bilirubin Urine: NEGATIVE
GLUCOSE, UA: NEGATIVE mg/dL
KETONES UR: NEGATIVE mg/dL
Leukocytes, UA: NEGATIVE
NITRITE: NEGATIVE
PH: 6 (ref 5.0–8.0)
PROTEIN: NEGATIVE mg/dL
Specific Gravity, Urine: 1.025 (ref 1.005–1.030)
Urobilinogen, UA: 0.2 mg/dL (ref 0.0–1.0)

## 2014-06-13 LAB — URINALYSIS, MICROSCOPIC ONLY
Casts: NONE SEEN
Crystals: NONE SEEN

## 2014-06-13 LAB — WET PREP FOR TRICH, YEAST, CLUE
Trich, Wet Prep: NONE SEEN
Yeast Wet Prep HPF POC: NONE SEEN

## 2014-06-13 LAB — RPR

## 2014-06-13 LAB — LIPID PANEL
Cholesterol: 177 mg/dL (ref 0–200)
HDL: 50 mg/dL (ref >39)
LDL CALC: 107 mg/dL — AB (ref 0–99)
TRIGLYCERIDES: 102 mg/dL (ref <150)
Total CHOL/HDL Ratio: 3.5 Ratio
VLDL: 20 mg/dL (ref 0–40)

## 2014-06-13 LAB — COMPREHENSIVE METABOLIC PANEL
ALBUMIN: 4 g/dL (ref 3.5–5.2)
ALK PHOS: 66 U/L (ref 39–117)
ALT: 8 U/L (ref 0–35)
AST: 11 U/L (ref 0–37)
BILIRUBIN TOTAL: 0.4 mg/dL (ref 0.2–1.2)
BUN: 8 mg/dL (ref 6–23)
CO2: 25 mEq/L (ref 19–32)
CREATININE: 0.7 mg/dL (ref 0.50–1.10)
Calcium: 8.9 mg/dL (ref 8.4–10.5)
Chloride: 103 mEq/L (ref 96–112)
GLUCOSE: 66 mg/dL — AB (ref 70–99)
Potassium: 3.6 mEq/L (ref 3.5–5.3)
Sodium: 138 mEq/L (ref 135–145)
Total Protein: 7.2 g/dL (ref 6.0–8.3)

## 2014-06-13 LAB — HIV ANTIBODY (ROUTINE TESTING W REFLEX): HIV 1&2 Ab, 4th Generation: NONREACTIVE

## 2014-06-13 MED ORDER — CLOBETASOL PROPIONATE 0.05 % EX CREA: 1.0000 "application " | TOPICAL_CREAM | Freq: Two times a day (BID) | CUTANEOUS | Status: DC

## 2014-06-13 NOTE — Progress Notes (Signed)
Patient ID: Shelley Vargas, female   DOB: 09-05-1976, 38 y.o.   MRN: [id number]   Subjective:    Patient ID: Shelley NeatAronzo Toohey, female    DOB: 09-05-1976, 38 y.o.   MRN: [id number]  Patient presents for Possible UTI, Vaginal irritation and Gynecologic Exam  patient here for her yearly physical exam and labs. She also complains of vaginal discharge for the past week denies any itching or burning. She's also had some urinary frequency was concern about urinary tract infection. She will like to have HIV test done with her labs as well. Her last set of fasting labs were back in 2012.  Should also like another cream for her eczema of her hands. She was on triamcinolone the past which did not help as much Due for Mammogram age 38  LMP 1st of July Review Of Systems:  GEN- denies fatigue, fever, weight loss,weakness, recent illness HEENT- denies eye drainage, change in vision, nasal discharge, CVS- denies chest pain, palpitations RESP- denies SOB, cough, wheeze ABD- denies N/V, change in stools, abd pain GU- denies dysuria, hematuria, dribbling, incontinence MSK- denies joint pain, muscle aches, injury Neuro- denies headache, dizziness, syncope, seizure activity       Objective:    BP 122/68  Pulse 64  Temp(Src) 98.4 F (36.9 C) (Oral)  Resp 14  Ht 5' 5.5" (1.664 m)  Wt 194 lb (87.998 kg)  BMI 31.78 kg/m2  LMP 05/28/2014 GEN- NAD, alert and oriented x3 HEENT- PERRL, EOMI, non injected sclera, pink conjunctiva, MMM, oropharynx clear Neck- Supple, no thyromegaly Breast- normal symmetry, no nipple inversion,no nipple drainage, no nodules or lumps felt Nodes- no axillary nodes CVS- RRR, no murmur RESP-CTAB ABD-NABS,soft,NT,ND GU- normal external genitalia, vaginal mucosa pink and moist, cervix visualized no growth, no blood form os,+ discharge, no CMT, no ovarian masses, uterus normal size Skin- eczema of rleft hand wrist , left antiecubital fossa, lateral aspect of right foot EXT- No  edema Pulses- Radial 2+        Assessment & Plan:      Problem List Items Addressed This Visit   Routine general medical examination at a health care facility   Relevant Orders      CBC with Differential      Comprehensive metabolic panel      Lipid panel    Other Visit Diagnoses   Urinary frequency    -  Primary    Relevant Orders       Urinalysis, Routine w reflex microscopic (Completed)    Vaginitis and vulvovaginitis        Relevant Orders       GC/chlamydia probe amp, genital       WET PREP FOR TRICH, YEAST, CLUE (Completed)    Screening for malignant neoplasm of the cervix        Relevant Orders       PAP, ThinPrep ASCUS Rflx HPV Rflx Type    Exposure to viral disease        Relevant Orders       HIV antibody (with reflex)       RPR       Note: This dictation was prepared with Dragon dictation along with smaller phrase technology. Any transcriptional errors that result from this process are unintentional.

## 2014-06-13 NOTE — Assessment & Plan Note (Signed)
Trial fo clobetasol

## 2014-06-13 NOTE — Patient Instructions (Signed)
Mammogram to be done Age 38 Clobetasol cream for eczema, We will call with lab results F/U as needed

## 2014-06-13 NOTE — Assessment & Plan Note (Signed)
Cultures pending , no sign of UTI

## 2014-06-13 NOTE — Assessment & Plan Note (Signed)
PAP smear done Mammo age 38 Tdap to be done at Health dept due to insurance coverage Fasting labs

## 2014-06-14 LAB — GC/CHLAMYDIA PROBE AMP
CT PROBE, AMP APTIMA: NEGATIVE
GC PROBE AMP APTIMA: NEGATIVE

## 2014-06-16 ENCOUNTER — Encounter: Payer: Self-pay | Admitting: *Deleted

## 2014-06-16 LAB — PAP THINPREP ASCUS RFLX HPV RFLX TYPE

## 2014-07-07 ENCOUNTER — Other Ambulatory Visit: Payer: Self-pay | Admitting: *Deleted

## 2014-07-07 MED ORDER — METRONIDAZOLE 500 MG PO TABS: 500.0000 mg | ORAL_TABLET | Freq: Three times a day (TID) | ORAL | Status: DC

## 2014-07-18 ENCOUNTER — Ambulatory Visit (INDEPENDENT_AMBULATORY_CARE_PROVIDER_SITE_OTHER): Payer: Medicaid Other | Admitting: Family Medicine

## 2014-07-18 ENCOUNTER — Encounter: Payer: Self-pay | Admitting: Family Medicine

## 2014-07-18 VITALS — BP 130/96 | HR 68 | Temp 98.0°F | Resp 16 | Wt 192.0 lb

## 2014-07-18 DIAGNOSIS — H1089 Other conjunctivitis: Secondary | ICD-10-CM

## 2014-07-18 DIAGNOSIS — A499 Bacterial infection, unspecified: Secondary | ICD-10-CM

## 2014-07-18 DIAGNOSIS — B9689 Other specified bacterial agents as the cause of diseases classified elsewhere: Secondary | ICD-10-CM

## 2014-07-18 DIAGNOSIS — H109 Unspecified conjunctivitis: Secondary | ICD-10-CM

## 2014-07-18 MED ORDER — POLYMYXIN B-TRIMETHOPRIM 10000-0.1 UNIT/ML-% OP SOLN: 1.0000 [drp] | OPHTHALMIC | Status: DC

## 2014-07-18 MED ORDER — POLYMYXIN B-TRIMETHOPRIM 10000-0.1 UNIT/ML-% OP SOLN: OPHTHALMIC | Status: DC

## 2014-07-18 NOTE — Patient Instructions (Signed)
Take your contact out Wear glasses  Bacterial Conjunctivitis Bacterial conjunctivitis (commonly called pink eye) is redness, soreness, or puffiness (inflammation) of the white part of your eye. It is caused by a germ called bacteria. These germs can easily spread from person to person (contagious). Your eye often will become red or pink. Your eye may also become irritated, watery, or have a thick discharge.  HOME CARE   Apply a cool, clean washcloth over closed eyelids. Do this for 10-20 minutes, 3-4 times a day while you have pain.  Gently wipe away any fluid coming from the eye with a warm, wet washcloth or cotton ball.  Wash your hands often with soap and water. Use paper towels to dry your hands.  Do not share towels or washcloths.  Change or wash your pillowcase every day.  Do not use eye makeup until the infection is gone.  Do not use machines or drive if your vision is blurry.  Stop using contact lenses. Do not use them again until your doctor says it is okay.  Do not touch the tip of the eye drop bottle or medicine tube with your fingers when you put medicine on the eye. GET HELP RIGHT AWAY IF:   Your eye is not better after 3 days of starting your medicine.  You have a yellowish fluid coming out of the eye.  You have more pain in the eye.  Your eye redness is spreading.  Your vision becomes blurry.  You have a fever or lasting symptoms for more than 2-3 days.  You have a fever and your symptoms suddenly get worse.  You have pain in the face.  Your face gets red or puffy (swollen). MAKE SURE YOU:   Understand these instructions.  Will watch this condition.  Will get help right away if you are not doing well or get worse. Document Released: 08/23/2008 Document Revised: 10/31/2012 Document Reviewed: 07/20/2012 [facility] Patient Information 2015 [place]. This information is not intended to replace advice given to you by your health care provider. Make  sure you discuss any questions you have with your health care provider.

## 2014-07-18 NOTE — Progress Notes (Signed)
Patient ID: Shelley Vargas, female   DOB: 03-14-1976, 38 y.o.   MRN: [id number]   Subjective:    Patient ID: Shelley Vargas, female    DOB: 03-14-1976, 38 y.o.   MRN: [id number]  Patient presents for Pink eye  patient here with left pink eye she has had some drainage this morning as well she denies any pain or change her vision. [facility] had [facility] drainage and she was helping clean her eyes she thinks she may have touched her own eye.     Review Of Systems:  GEN- denies fatigue, fever, weight loss,weakness, recent illness HEENT- + eye drainage, denies change in vision, nasal discharge, CVS- denies chest pain, palpitations RESP- denies SOB, cough, wheeze Neuro- denies headache, dizziness, syncope, seizure activity       Objective:    BP 130/96  Pulse 68  Temp(Src) 98 F (36.7 C) (Oral)  Resp 16  Wt 192 lb (87.091 kg) GEN- NAD, alert and oriented x3 HEENT- PERRL, EOMI, + left injected sclera, + left injected conjunctiva, + green drainage from corner of lid, right sclera clear, conjunctiva pink, vision grossly in tact, MMM, oropharynx clear Neck- Supple, no LAD         Assessment & Plan:      Problem List Items Addressed This Visit   None    Visit Diagnoses   Bacterial conjunctivitis of left eye    -  Primary    polytrim drops x 7 days, remove contact        Note: This dictation was prepared with Dragon dictation along with smaller phrase technology. Any transcriptional errors that result from this process are unintentional.

## 2015-01-23 ENCOUNTER — Emergency Department (HOSPITAL_COMMUNITY)
Admission: EM | Admit: 2015-01-23 | Discharge: 2015-01-23 | Disposition: A | Discharge status: Home / Self Care | Payer: 59 | Attending: Emergency Medicine | Admitting: Emergency Medicine

## 2015-01-23 ENCOUNTER — Encounter (HOSPITAL_COMMUNITY): Payer: Self-pay | Admitting: Emergency Medicine

## 2015-01-23 DIAGNOSIS — Z872 Personal history of diseases of the skin and subcutaneous tissue: Secondary | ICD-10-CM | POA: Diagnosis not present

## 2015-01-23 DIAGNOSIS — Z862 Personal history of diseases of the blood and blood-forming organs and certain disorders involving the immune mechanism: Secondary | ICD-10-CM | POA: Diagnosis not present

## 2015-01-23 DIAGNOSIS — Z8709 Personal history of other diseases of the respiratory system: Secondary | ICD-10-CM | POA: Diagnosis not present

## 2015-01-23 DIAGNOSIS — R1110 Vomiting, unspecified: Secondary | ICD-10-CM | POA: Diagnosis present

## 2015-01-23 DIAGNOSIS — Z880 Allergy status to penicillin: Secondary | ICD-10-CM | POA: Diagnosis not present

## 2015-01-23 DIAGNOSIS — R42 Dizziness and giddiness: Secondary | ICD-10-CM | POA: Diagnosis not present

## 2015-01-23 LAB — CBC
HCT: 41.2 % (ref 36.0–46.0)
Hemoglobin: 13.6 g/dL (ref 12.0–15.0)
MCH: 28.6 pg (ref 26.0–34.0)
MCHC: 33 g/dL (ref 30.0–36.0)
MCV: 86.6 fL (ref 78.0–100.0)
Platelets: 243 10*3/uL (ref 150–400)
RBC: 4.76 MIL/uL (ref 3.87–5.11)
RDW: 12 % (ref 11.5–15.5)
WBC: 7.1 10*3/uL (ref 4.0–10.5)

## 2015-01-23 LAB — BASIC METABOLIC PANEL
Anion gap: 4 — ABNORMAL LOW (ref 5–15)
BUN: 9 mg/dL (ref 6–23)
CHLORIDE: 106 mmol/L (ref 96–112)
CO2: 25 mmol/L (ref 19–32)
CREATININE: 0.73 mg/dL (ref 0.50–1.10)
Calcium: 8.7 mg/dL (ref 8.4–10.5)
GFR calc Af Amer: >90 mL/min (ref >90)
GFR calc non Af Amer: >90 mL/min (ref >90)
Glucose, Bld: 117 mg/dL — ABNORMAL HIGH (ref 70–99)
POTASSIUM: 3.5 mmol/L (ref 3.5–5.1)
Sodium: 135 mmol/L (ref 135–145)

## 2015-01-23 MED ORDER — MECLIZINE HCL 25 MG PO TABS: 25.0000 mg | ORAL_TABLET | Freq: Four times a day (QID) | ORAL | Status: DC

## 2015-01-23 MED ORDER — MECLIZINE HCL 12.5 MG PO TABS
25.0000 mg | ORAL_TABLET | Freq: Once | ORAL | Status: AC
Start: 2015-01-23 — End: 2015-01-23
  Administered 2015-01-23: 25 mg via ORAL

## 2015-01-23 MED ORDER — MECLIZINE HCL 12.5 MG PO TABS
25.0000 mg | ORAL_TABLET | Freq: Once | ORAL | Status: AC
  Filled 2015-01-23: qty 2

## 2015-01-23 NOTE — ED Notes (Addendum)
Pt brought in EMS. pt reports generalized weakness,n/v, intermittent dizziness,since noon today. Pt cbg en route 76.

## 2015-01-23 NOTE — ED Notes (Signed)
poc cbg in triage 134.

## 2015-01-23 NOTE — ED Provider Notes (Signed)
CSN: [id number]     Arrival date & time 01/23/15  1705 History  This chart was scribed for Donnetta Hutching, MD by Abel Presto, ED Scribe. This patient was seen in room APA15/APA15 and the patient's care was started at 9:46 PM.     Chief Complaint  Patient presents with  . Emesis    The history is provided by the patient. No language interpreter was used.   HPI Comments: Shelley Vargas is a 39 y.o. female brought in by ambulance, who presents to the Emergency Department complaining of constant dizziness with onset at 12 PM. Pt notes associated vomiting, chills, weakness, and lightheadedness. Pt notes h/o iron deficiency anemia. Pt's LNMP was a month ago. Pt's BP was 108/152 and CBG of 76 en route. 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 No stiff neck  Past Medical History  Diagnosis Date  . Bronchitis   . Iron deficiency anemia   . Eczema   . Bronchitis    Past Surgical History  Procedure Laterality Date  . Rods in left leg     Family History  Problem Relation Age of Onset  . Hypertension Mother   . Hypertension Father    History  Substance Use Topics  . Smoking status: Never Smoker   . Smokeless tobacco: Not on file  . Alcohol Use: 0.6 oz/week    1 Glasses of wine per week     Comment: occasioanl   OB History    No data available     Review of Systems A complete 10 system review of systems was obtained and all systems are negative except as noted in the HPI and PMH.     Allergies  Penicillins  Home Medications   Prior to Admission medications   Medication Sig Start Date End Date Taking? Authorizing Provider  meclizine (ANTIVERT) 25 MG tablet Take 1 tablet (25 mg total) by mouth 4 (four) times daily. 01/23/15   Donnetta Hutching, MD  trimethoprim-polymyxin b (POLYTRIM) ophthalmic solution 1 drop left eye four times a day for 7 days 07/18/14   Salley Scarlet, MD   BP 125/74 mmHg  Pulse 74  Temp(Src) 98.1 F (36.7 C) (Oral)  Resp 15  SpO2 100%  LMP  12/02/2014 Physical Exam  Constitutional: She is oriented to person, place, and time. She appears well-developed and well-nourished.  HENT:  Head: Normocephalic and atraumatic.  Eyes: Conjunctivae and EOM are normal. Pupils are equal, round, and reactive to light.  Neck: Normal range of motion. Neck supple.  Cardiovascular: Normal rate and regular rhythm.   Pulmonary/Chest: Effort normal and breath sounds normal.  Abdominal: Soft. Bowel sounds are normal.  Musculoskeletal: Normal range of motion.  Neurological: She is alert and oriented to person, place, and time.  Skin: Skin is warm and dry.  Psychiatric: She has a normal mood and affect. Her behavior is normal.  Nursing note and vitals reviewed.   ED Course  Procedures (including critical care time) DIAGNOSTIC STUDIES: Oxygen Saturation is 100% on room air, normal by my interpretation.    COORDINATION OF CARE: 9:52 PM Discussed treatment plan with patient at beside, the patient agrees with the plan and has no further questions at this time.   Labs Review Labs Reviewed  BASIC METABOLIC PANEL - Abnormal; Notable for the following:    Glucose, Bld 117 (*)    Anion gap 4 (*)    All other components within normal limits  CBC  CBG MONITORING, ED  Imaging Review No results found.   EKG Interpretation None     Results for orders placed or performed during the hospital encounter of 01/23/15  CBC  (at [facility])  Result Value Ref Range   WBC 7.1 4.0 - 10.5 K/uL   RBC 4.76 3.87 - 5.11 MIL/uL   Hemoglobin 13.6 12.0 - 15.0 g/dL   HCT 09.841.2 11.936.0 - 14.746.0 %   MCV 86.6 78.0 - 100.0 fL   MCH 28.6 26.0 - 34.0 pg   MCHC 33.0 30.0 - 36.0 g/dL   RDW 82.912.0 56.211.5 - 13.015.5 %   Platelets 243 150 - 400 K/uL  Basic metabolic panel  (at [facility])  Result Value Ref Range   Sodium 135 135 - 145 mmol/L   Potassium 3.5 3.5 - 5.1 mmol/L   Chloride 106 96 - 112 mmol/L   CO2 25 19 - 32 mmol/L   Glucose, Bld 117 (H) 70 - 99  mg/dL   BUN 9 6 - 23 mg/dL   Creatinine, Ser 8.650.73 0.50 - 1.10 mg/dL   Calcium 8.7 8.4 - 78.410.5 mg/dL   GFR calc non Af Amer >90 >90 mL/min   GFR calc Af Amer >90 >90 mL/min   Anion gap 4 (L) 5 - 15   No results found.   MDM   Final diagnoses:  Vertigo   No neurological deficits. Screening labs normal. Discharge medications Antivert 25 mg. Discussed with patient. I personally performed the services described in this documentation, which was scribed in my presence. The recorded information has been reviewed and is accurate.     Donnetta HutchingBrian Ramon Brant, MD 01/24/15 2043

## 2015-01-23 NOTE — ED Notes (Signed)
Discharge instructions given, pt demonstrated teach back and verbal understanding. No concerns voiced.  

## 2015-01-23 NOTE — Discharge Instructions (Signed)
Benign Positional Vertigo Vertigo means you feel like you or your surroundings are moving when they are not. Benign positional vertigo is the most common form of vertigo. Benign means that the cause of your condition is not serious. Benign positional vertigo is more common in older adults. CAUSES  Benign positional vertigo is the result of an upset in the labyrinth system. This is an area in the middle ear that helps control your balance. This may be caused by a viral infection, head injury, or repetitive motion. However, often no specific cause is found. SYMPTOMS  Symptoms of benign positional vertigo occur when you move your head or eyes in different directions. Some of the symptoms may include:  Loss of balance and falls.  Vomiting.  Blurred vision.  Dizziness.  Nausea.  Involuntary eye movements (nystagmus). DIAGNOSIS  Benign positional vertigo is usually diagnosed by physical exam. If the specific cause of your benign positional vertigo is unknown, your caregiver may perform imaging tests, such as magnetic resonance imaging (MRI) or computed tomography (CT). TREATMENT  Your caregiver may recommend movements or procedures to correct the benign positional vertigo. Medicines such as meclizine, benzodiazepines, and medicines for nausea may be used to treat your symptoms. In rare cases, if your symptoms are caused by certain conditions that affect the inner ear, you may need surgery. HOME CARE INSTRUCTIONS   Follow your caregiver's instructions.  Move slowly. Do not make sudden body or head movements.  Avoid driving.  Avoid operating heavy machinery.  Avoid performing any tasks that would be dangerous to you or others during a vertigo episode.  Drink enough fluids to keep your urine clear or pale yellow. SEEK IMMEDIATE MEDICAL CARE IF:   You develop problems with walking, weakness, numbness, or using your arms, hands, or legs.  You have difficulty speaking.  You develop  severe headaches.  Your nausea or vomiting continues or gets worse.  You develop visual changes.  Your family or friends notice any behavioral changes.  Your condition gets worse.  You have a fever.  You develop a stiff neck or sensitivity to light. MAKE SURE YOU:   Understand these instructions.  Will watch your condition.  Will get help right away if you are not doing well or get worse. Document Released: 08/22/2006 Document Revised: 02/06/2012 Document Reviewed: 08/04/2011 [facility] Patient Information 2015 [employer] [place]. This information is not intended to replace advice given to you by your health care provider. Make sure you discuss any questions you have with your health care provider.  Medication for dizziness. Rest. Increase fluids.

## 2015-01-26 LAB — CBG MONITORING, ED: Glucose-Capillary: 134 mg/dL — ABNORMAL HIGH (ref 70–99)

## 2015-02-16 ENCOUNTER — Ambulatory Visit (INDEPENDENT_AMBULATORY_CARE_PROVIDER_SITE_OTHER): Payer: 59 | Admitting: Family Medicine

## 2015-02-16 ENCOUNTER — Encounter: Payer: Self-pay | Admitting: Family Medicine

## 2015-02-16 VITALS — BP 128/74 | HR 76 | Temp 98.1°F | Resp 14 | Ht 65.5 in | Wt 193.0 lb

## 2015-02-16 DIAGNOSIS — Z111 Encounter for screening for respiratory tuberculosis: Secondary | ICD-10-CM

## 2015-02-16 DIAGNOSIS — N3000 Acute cystitis without hematuria: Secondary | ICD-10-CM

## 2015-02-16 DIAGNOSIS — N760 Acute vaginitis: Secondary | ICD-10-CM | POA: Diagnosis not present

## 2015-02-16 LAB — URINALYSIS, ROUTINE W REFLEX MICROSCOPIC
Bilirubin Urine: NEGATIVE
Glucose, UA: NEGATIVE mg/dL
Ketones, ur: NEGATIVE mg/dL
NITRITE: NEGATIVE
Protein, ur: NEGATIVE mg/dL
Specific Gravity, Urine: >1.03 — ABNORMAL HIGH (ref 1.005–1.030)
Urobilinogen, UA: 0.2 mg/dL (ref 0.0–1.0)
pH: 6 (ref 5.0–8.0)

## 2015-02-16 LAB — WET PREP FOR TRICH, YEAST, CLUE
Trich, Wet Prep: NONE SEEN
Yeast Wet Prep HPF POC: NONE SEEN

## 2015-02-16 LAB — URINALYSIS, MICROSCOPIC ONLY
Casts: NONE SEEN
Crystals: NONE SEEN

## 2015-02-16 MED ORDER — FLUCONAZOLE 150 MG PO TABS: 150.0000 mg | ORAL_TABLET | Freq: Once | ORAL | Status: DC

## 2015-02-16 MED ORDER — CEPHALEXIN 500 MG PO CAPS: 500.0000 mg | ORAL_CAPSULE | Freq: Two times a day (BID) | ORAL | Status: DC

## 2015-02-16 NOTE — Patient Instructions (Addendum)
Use summers eve products Try trimming hair instead of shaving to decrease irritation Use hydrocortisone 1% on outer part for itching twice a day as needed Take antibiotics as prescribed for urine Take diflucan after you complete antibiotics F/U as needed

## 2015-02-16 NOTE — Assessment & Plan Note (Signed)
Irritation from shaving, advised to discontinue, use topical 1% hydrocoritsone BID for itch, change vaginal products to summers eve or less fragrance as well

## 2015-02-16 NOTE — Progress Notes (Signed)
Patient ID: Shelley Vargas, female   DOB: [birth year], 39 y.o.   MRN: [id number]   Subjective:    Patient ID: Shelley Vargas, female    DOB: [birth year], 39 y.o.   MRN: [id number]  Patient presents for Vaginal Irritation and TB Test  issue here with vaginal irritation she states that she shaved recently and has had some irritation and itching on the labia. She's also had wetness noted in her panty liner and she was concern for increased infection. She did take a Monistat over-the-counter over the weekend. She is sexually active with one partner. Her last initial cycle was 2 weeks ago. She did change her body wash as well during the time the itching started.  She is due for a PPD for her job  she's has had mild dysuria symptoms and  felt like at times that she had a urine odor to her discharge  Review Of Systems:  GEN- denies fatigue, fever, weight loss,weakness, recent illness HEENT- denies eye drainage, change in vision, nasal discharge, CVS- denies chest pain, palpitations RESP- denies SOB, cough, wheeze ABD- denies N/V, change in stools, abd pain GU- + dysuria, hematuria, dribbling, incontinence MSK- denies joint pain, muscle aches, injury Neuro- denies headache, dizziness, syncope, seizure activity       Objective:    BP 128/74 mmHg  Pulse 76  Temp(Src) 98.1 F (36.7 C) (Oral)  Resp 14  Ht 5' 5.5" (1.664 m)  Wt 193 lb (87.544 kg)  BMI 31.62 kg/m2  LMP 02/02/2015 (Approximate) GEN- NAD, alert and oriented x3 ABD-Soft,NT, no suprapubic tenderness GU- normal external genitalia with erythema and mild hypopigmentation of end of labia majora, vaginal mucosa pink and moist, cervix visualized no growth, no blood form os, + thick white discharge, no CMT, no ovarian masses, uterus normal size  PPD placed       Assessment & Plan:      Problem List Items Addressed This Visit      Unprioritized   Vaginitis   Relevant Orders   WET PREP FOR TRICH, YEAST, CLUE (Completed)     Other Visit Diagnoses    Acute cystitis without hematuria    -  Primary    Wet prep neg, UA concern for mild Cystitis, treat with keflex x 3 days, followed by Diflucan.     Relevant Orders    Urinalysis, Routine w reflex microscopic (Completed)    Screening-pulmonary TB        Relevant Orders    PPD (Completed)       Note: This dictation was prepared with Dragon dictation along with smaller phrase technology. Any transcriptional errors that result from this process are unintentional.

## 2015-02-18 ENCOUNTER — Ambulatory Visit: Payer: 59 | Admitting: *Deleted

## 2015-02-18 DIAGNOSIS — Z111 Encounter for screening for respiratory tuberculosis: Secondary | ICD-10-CM

## 2015-02-18 LAB — TB SKIN TEST
Induration: 0 mm
TB Skin Test: NEGATIVE

## 2015-03-16 ENCOUNTER — Emergency Department (HOSPITAL_COMMUNITY): Payer: 59

## 2015-03-16 ENCOUNTER — Emergency Department (HOSPITAL_COMMUNITY)
Admission: EM | Admit: 2015-03-16 | Discharge: 2015-03-16 | Disposition: A | Discharge status: Home / Self Care | Payer: 59 | Attending: Emergency Medicine | Admitting: Emergency Medicine

## 2015-03-16 ENCOUNTER — Encounter (HOSPITAL_COMMUNITY): Payer: Self-pay | Admitting: Emergency Medicine

## 2015-03-16 DIAGNOSIS — S025XXA Fracture of tooth (traumatic), initial encounter for closed fracture: Secondary | ICD-10-CM | POA: Insufficient documentation

## 2015-03-16 DIAGNOSIS — Y92093 Driveway of other non-institutional residence as the place of occurrence of the external cause: Secondary | ICD-10-CM | POA: Diagnosis not present

## 2015-03-16 DIAGNOSIS — S0083XA Contusion of other part of head, initial encounter: Secondary | ICD-10-CM | POA: Insufficient documentation

## 2015-03-16 DIAGNOSIS — Z792 Long term (current) use of antibiotics: Secondary | ICD-10-CM | POA: Diagnosis not present

## 2015-03-16 DIAGNOSIS — S4991XA Unspecified injury of right shoulder and upper arm, initial encounter: Secondary | ICD-10-CM | POA: Insufficient documentation

## 2015-03-16 DIAGNOSIS — Z880 Allergy status to penicillin: Secondary | ICD-10-CM | POA: Insufficient documentation

## 2015-03-16 DIAGNOSIS — S0993XA Unspecified injury of face, initial encounter: Secondary | ICD-10-CM | POA: Diagnosis present

## 2015-03-16 DIAGNOSIS — S00511A Abrasion of lip, initial encounter: Secondary | ICD-10-CM | POA: Diagnosis not present

## 2015-03-16 DIAGNOSIS — Y9302 Activity, running: Secondary | ICD-10-CM | POA: Insufficient documentation

## 2015-03-16 DIAGNOSIS — S299XXA Unspecified injury of thorax, initial encounter: Secondary | ICD-10-CM | POA: Insufficient documentation

## 2015-03-16 DIAGNOSIS — Y998 Other external cause status: Secondary | ICD-10-CM | POA: Diagnosis not present

## 2015-03-16 DIAGNOSIS — W19XXXA Unspecified fall, initial encounter: Secondary | ICD-10-CM

## 2015-03-16 DIAGNOSIS — W01198A Fall on same level from slipping, tripping and stumbling with subsequent striking against other object, initial encounter: Secondary | ICD-10-CM | POA: Insufficient documentation

## 2015-03-16 DIAGNOSIS — S3992XA Unspecified injury of lower back, initial encounter: Secondary | ICD-10-CM | POA: Diagnosis not present

## 2015-03-16 DIAGNOSIS — Z8709 Personal history of other diseases of the respiratory system: Secondary | ICD-10-CM | POA: Diagnosis not present

## 2015-03-16 DIAGNOSIS — Z862 Personal history of diseases of the blood and blood-forming organs and certain disorders involving the immune mechanism: Secondary | ICD-10-CM | POA: Diagnosis not present

## 2015-03-16 DIAGNOSIS — Z872 Personal history of diseases of the skin and subcutaneous tissue: Secondary | ICD-10-CM | POA: Insufficient documentation

## 2015-03-16 DIAGNOSIS — T07XXXA Unspecified multiple injuries, initial encounter: Secondary | ICD-10-CM

## 2015-03-16 DIAGNOSIS — S0081XA Abrasion of other part of head, initial encounter: Secondary | ICD-10-CM

## 2015-03-16 MED ORDER — NAPROXEN 500 MG PO TABS: 500.0000 mg | ORAL_TABLET | Freq: Two times a day (BID) | ORAL | Status: DC

## 2015-03-16 MED ORDER — CYCLOBENZAPRINE HCL 5 MG PO TABS: 5.0000 mg | ORAL_TABLET | Freq: Two times a day (BID) | ORAL | Status: DC | PRN

## 2015-03-16 NOTE — Discharge Instructions (Signed)
Your x-rays today show no fractures. We are treating your pain and inflammation and muscle spasm. You will also need to see the dentist as planned about your broken tooth. Do not take the muscle relaxant if driving as it will make you sleepy.

## 2015-03-16 NOTE — ED Provider Notes (Signed)
CSN: [id number]     Arrival date & time 03/16/15  1229 History  This chart was scribed for non-physician practitioner Kerrie BuffaloHope Browning Southwood, NP, working with Rolland PorterMark James, MD by Littie Deedsichard Sun, ED Scribe. This patient was seen in room APFT23/APFT23 and the patient's care was started at 3:44 PM.     Chief Complaint  Patient presents with  . Fall   Patient is a 39 y.o. female presenting with fall. The history is provided by the patient. No language interpreter was used.  Fall This is a new problem. The current episode started 12 to 24 hours ago. The problem has not changed since onset.Associated symptoms include chest pain. She has tried nothing for the symptoms.   HPI Comments: Shelley Payorronzo Vargas is a 39 y.o. female who presents to the Emergency Department complaining of a fall that occurred last night while running from a dog. Patient states she fell on her face on a concrete driveway, which chipped one of her upper teeth. She reports having associated right shoulder pain, right rib pain, right chest wall pain, and right side pain. Patient had a dog bite 3 years ago and has had a fear of dogs since then.   Past Medical History  Diagnosis Date  . Bronchitis   . Iron deficiency anemia   . Eczema   . Bronchitis    Past Surgical History  Procedure Laterality Date  . Rods in left leg     Family History  Problem Relation Age of Onset  . Hypertension Mother   . Hypertension Father    History  Substance Use Topics  . Smoking status: Never Smoker   . Smokeless tobacco: Never Used  . Alcohol Use: 0.6 oz/week    1 Glasses of wine per week     Comment: occasioanl   OB History    Gravida Para Term Preterm AB TAB SAB Ectopic Multiple Living   5 3 3  2 1 1         Review of Systems  HENT: Positive for dental problem.   Cardiovascular: Positive for chest pain.  Musculoskeletal: Positive for back pain and arthralgias.  all other systems negative     Allergies  Penicillins  Home Medications    Prior to Admission medications   Medication Sig Start Date End Date Taking? Authorizing Provider  cephALEXin (KEFLEX) 500 MG capsule Take 1 capsule (500 mg total) by mouth 2 (two) times daily. Patient not taking: Reported on 03/16/2015 02/16/15   Salley ScarletKawanta F Sunwest, MD  cyclobenzaprine (FLEXERIL) 5 MG tablet Take 1 tablet (5 mg total) by mouth 2 (two) times daily as needed for muscle spasms. 03/16/15   Cyriah Childrey Orlene OchM Lanice Folden, NP  fluconazole (DIFLUCAN) 150 MG tablet Take 1 tablet (150 mg total) by mouth once. Patient not taking: Reported on 03/16/2015 02/16/15   Salley ScarletKawanta F , MD  naproxen (NAPROSYN) 500 MG tablet Take 1 tablet (500 mg total) by mouth 2 (two) times daily. 03/16/15   Kealy Lewter Orlene OchM Kiondre Grenz, NP   BP 120/66 mmHg  Pulse 78  Temp(Src) 99.3 F (37.4 C) (Oral)  Resp 20  Ht 5\' 4"  (1.626 m)  Wt 190 lb (86.183 kg)  BMI 32.60 kg/m2  SpO2 100%  LMP 03/16/2015 Physical Exam  Constitutional: She is oriented to person, place, and time. She appears well-developed and well-nourished.  HENT:  Right Ear: No hemotympanum.  Left Ear: No hemotympanum.  Front right tooth chipped. Ecchymosis of the face around the mouth. Tender on palpation. Abrasions of the face  noted.   Eyes: EOM are normal. Pupils are equal, round, and reactive to light.  Neck: Neck supple.  Cardiovascular: Normal rate.   Pulses:      Radial pulses are 2+ on the right side, and 2+ on the left side.  Pulmonary/Chest: Effort normal. She exhibits tenderness.  Tenderness to right chest wall with palpation. Posterior right rib pain.  Abdominal: Soft. There is no tenderness.  Musculoskeletal: She exhibits tenderness.  Pain with ROM of right shoulder, anterior aspect. Radial pulses 2+ bilateral. Grips equal.   Neurological: She is alert and oriented to person, place, and time. No cranial nerve deficit.  Good grips and good strength.  Skin: Skin is warm and dry.  Abrasions to upper lip, lower lip and chin.  Psychiatric: She has a normal mood  and affect. Her behavior is normal.  Nursing note and vitals reviewed.   ED Course  Procedures  DIAGNOSTIC STUDIES: Oxygen Saturation is 100% on room air, normal by my interpretation.    COORDINATION OF CARE: 3:48 PM-Discussed treatment plan which includes medications, XR imaging, and CT imaging with pt at bedside and pt agreed to plan.    Labs Review Labs Reviewed - No data to display  Imaging Review Dg Ribs Unilateral W/chest Right  03/16/2015   CLINICAL DATA:  Right shoulder pain after a fall last night while running from a dog.  EXAM: RIGHT RIBS AND CHEST - 3+ VIEW; RIGHT SHOULDER - 2+ VIEW  COMPARISON:  None.  FINDINGS: No fracture or other bone lesions are seen involving the ribs. There is no evidence of pneumothorax or pleural effusion. Both lungs are clear. Heart size and mediastinal contours are within normal limits.  IMPRESSION: Normal exam.   Electronically Signed   By: Francene Boyers M.D.   On: 03/16/2015 15:40   Dg Shoulder Right  03/16/2015   CLINICAL DATA:  Right shoulder pain after a fall last night while running from a dog.  EXAM: RIGHT RIBS AND CHEST - 3+ VIEW; RIGHT SHOULDER - 2+ VIEW  COMPARISON:  None.  FINDINGS: No fracture or other bone lesions are seen involving the ribs. There is no evidence of pneumothorax or pleural effusion. Both lungs are clear. Heart size and mediastinal contours are within normal limits.  IMPRESSION: Normal exam.   Electronically Signed   By: Francene Boyers M.D.   On: 03/16/2015 15:40   Ct Maxillofacial Wo Cm  03/16/2015   CLINICAL DATA:  Right facial abrasion and pain after fall over dog yesterday. Initial encounter.  EXAM: CT MAXILLOFACIAL WITHOUT CONTRAST  TECHNIQUE: Multidetector CT imaging of the maxillofacial structures was performed. Multiplanar CT image reconstructions were also generated. A small metallic BB was placed on the right temple in order to reliably differentiate right from left.  COMPARISON:  None.  FINDINGS: Paranasal  sinuses appear normal. No fracture or other bony abnormality is noted. Pterygoid plates appear normal globes and orbits appear normal.  IMPRESSION: No abnormality seen in the maxillofacial region.   Electronically Signed   By: Lupita Raider, M.D.   On: 03/16/2015 15:36     MDM  39 y.o. female with chipped tooth, facial abrasions and bruising, right rib pain and back pain s/p fall after running from a dog. Stable for d/c without focal neuro deficits. Will treat for abrasions and contusions. She will follow up with the dentist as scheduled next week. Discussed with the patient clinical and x-ray findings and plan of care. All questioned fully answered. She will  return if any problems arise.   Final diagnoses:  Multiple contusions  Abrasion of face, initial encounter  Fall, initial encounter    I personally performed the services described in this documentation, which was scribed in my presence. The recorded information has been reviewed and is accurate.    [address] [place], NP 03/17/15 1713  Rolland Porter, MD 03/25/15 Serena Croissant

## 2015-03-16 NOTE — ED Notes (Signed)
Pt reports being chased by a dog. Pt fell hitting her face in gravel, chipped her front tooth. Pt c/o R side pain and back pain.

## 2015-03-16 NOTE — ED Notes (Signed)
Pt verbalized understanding of no driving and to use caution within 4 hours of taking pain meds due to meds cause drowsiness 

## 2015-04-28 ENCOUNTER — Telehealth: Payer: Self-pay | Admitting: Family Medicine

## 2015-04-28 NOTE — Telephone Encounter (Signed)
[phone]  Patient calling to speak with you regarding possibly getting something called in for discharge

## 2015-04-28 NOTE — Telephone Encounter (Signed)
Call placed to patient. No answer. No VM.  

## 2015-04-29 MED ORDER — FLUCONAZOLE 150 MG PO TABS: 150.0000 mg | ORAL_TABLET | Freq: Once | ORAL | Status: DC

## 2015-04-29 NOTE — Telephone Encounter (Signed)
Call placed to patient and patient made aware.   Prescription sent to pharmacy.  

## 2015-04-29 NOTE — Telephone Encounter (Signed)
Call placed to patient.   States that she has white discharge with no odor, but it is the same as she had before when MD gave her ABTx.   Requesting refill.   MD please advise.

## 2015-04-29 NOTE — Telephone Encounter (Signed)
Diflucan x 1 dose, if not better schedule visit

## 2015-05-04 ENCOUNTER — Ambulatory Visit (INDEPENDENT_AMBULATORY_CARE_PROVIDER_SITE_OTHER): Payer: 59 | Admitting: Family Medicine

## 2015-05-04 ENCOUNTER — Ambulatory Visit: Payer: Self-pay | Admitting: Family Medicine

## 2015-05-04 ENCOUNTER — Encounter: Payer: Self-pay | Admitting: Family Medicine

## 2015-05-04 VITALS — BP 128/74 | HR 80 | Temp 98.2°F | Resp 14 | Ht 65.5 in | Wt 179.0 lb

## 2015-05-04 DIAGNOSIS — B86 Scabies: Secondary | ICD-10-CM

## 2015-05-04 DIAGNOSIS — N760 Acute vaginitis: Secondary | ICD-10-CM

## 2015-05-04 LAB — WET PREP FOR TRICH, YEAST, CLUE
Trich, Wet Prep: NONE SEEN
Yeast Wet Prep HPF POC: NONE SEEN

## 2015-05-04 MED ORDER — PERMETHRIN 5 % EX CREA: 1.0000 "application " | TOPICAL_CREAM | Freq: Once | CUTANEOUS | Status: DC

## 2015-05-04 MED ORDER — TRIAMCINOLONE ACETONIDE 0.1 % EX CREA: 1.0000 "application " | TOPICAL_CREAM | Freq: Two times a day (BID) | CUTANEOUS | Status: DC

## 2015-05-04 NOTE — Progress Notes (Signed)
Patient ID: Shelley Vargas, female   DOB: 1976/11/04, 39 y.o.   MRN: [id number]   Subjective:    Patient ID: Shelley Vargas, female    DOB: 1976/11/04, 39 y.o.   MRN: [id number]  Patient presents for Vaginal Discharge and Skin Irritation  Vaginal discharge- has wet like feeling, no odor, s/p diflucan, no vaginal itching or bleeding   Rash legs, left arm chest, was cleaning her in laws house, they advised her some children were over and had bites, they now have them, after cleaning, the next day she had bites all over her, took benadryl for itching     Review Of Systems:  GEN- denies fatigue, fever, weight loss,weakness, recent illness HEENT- denies eye drainage, change in vision, nasal discharge, CVS- denies chest pain, palpitations RESP- denies SOB, cough, wheeze ABD- denies N/V, change in stools, abd pain GU- denies dysuria, hematuria, dribbling, incontinence MSK- denies joint pain, muscle aches, injury Neuro- denies headache, dizziness, syncope, seizure activity       Objective:    BP 128/74 mmHg  Pulse 80  Temp(Src) 98.2 F (36.8 C) (Oral)  Resp 14  Ht 5' 5.5" (1.664 m)  Wt 179 lb (81.194 kg)  BMI 29.32 kg/m2  LMP 04/06/2015 (Approximate) GEN- NAD, alert and oriented x3 ABD-NABS,soft,NT,ND GU- normal external genitalia, vaginal mucosa pink and moist, cervix visualized no growth, no blood form os, minimal thin clear discharge, no CMT, no ovarian masses, uterus normal size Skin- scattered erythematous  maculopapulr lesions left leg and right leg, left arm, chest wall, no oral lesions, no burrow lines in web spaces, itching all over, +excoriations        Assessment & Plan:      Problem List Items Addressed This Visit    Vaginitis - Primary    Cultures taken, s/p yeast treatment      Relevant Orders   WET PREP FOR TRICH, YEAST, CLUE (Completed)   GC/Chlamydia Probe Amp    Other Visit Diagnoses    Scabies        concern for scabies vs bed bugs, treat with  permethrin, topical cortisone also given, instructions to clean linens       Note: This dictation was prepared with Dragon dictation along with smaller phrase technology. Any transcriptional errors that result from this process are unintentional.

## 2015-05-04 NOTE — Assessment & Plan Note (Signed)
Cultures taken, s/p yeast treatment

## 2015-05-04 NOTE — Patient Instructions (Signed)
Use cream as prescribed, apply from neck down  Use topical steroids We will call with lab results F/U as needed

## 2015-05-05 ENCOUNTER — Other Ambulatory Visit: Payer: Self-pay | Admitting: *Deleted

## 2015-05-05 LAB — GC/CHLAMYDIA PROBE AMP
CT Probe RNA: NEGATIVE
GC PROBE AMP APTIMA: NEGATIVE

## 2015-05-05 MED ORDER — METRONIDAZOLE 500 MG PO TABS: 500.0000 mg | ORAL_TABLET | Freq: Two times a day (BID) | ORAL | Status: DC

## 2015-05-13 ENCOUNTER — Telehealth: Payer: Self-pay | Admitting: Family Medicine

## 2015-05-13 NOTE — Telephone Encounter (Signed)
Send in  MEdrol dosepak Also repeat in the permethrin, if it has been 1 week since she used

## 2015-05-13 NOTE — Telephone Encounter (Signed)
Pt called and states that creams that were given to her for the rash all over have not helped at all and she would like something different. (pt aware that she may not get a call back today)

## 2015-05-14 MED ORDER — METHYLPREDNISOLONE 4 MG PO TBPK: ORAL_TABLET | ORAL | Status: DC

## 2015-05-14 MED ORDER — PERMETHRIN 5 % EX CREA: 1.0000 "application " | TOPICAL_CREAM | Freq: Once | CUTANEOUS | Status: DC

## 2015-05-14 NOTE — Telephone Encounter (Signed)
Med called to pharm and pt aware 

## 2015-07-16 IMAGING — CR DG CHEST 2V
2 series · 2 of 2 positions shown · non-contrast
Comparison: Chest radiograph October 22, 2013.

CLINICAL DATA: Chest pain with nonproductive cough.

EXAM:
CHEST  2 VIEW

[view not recorded (1 of 2)]
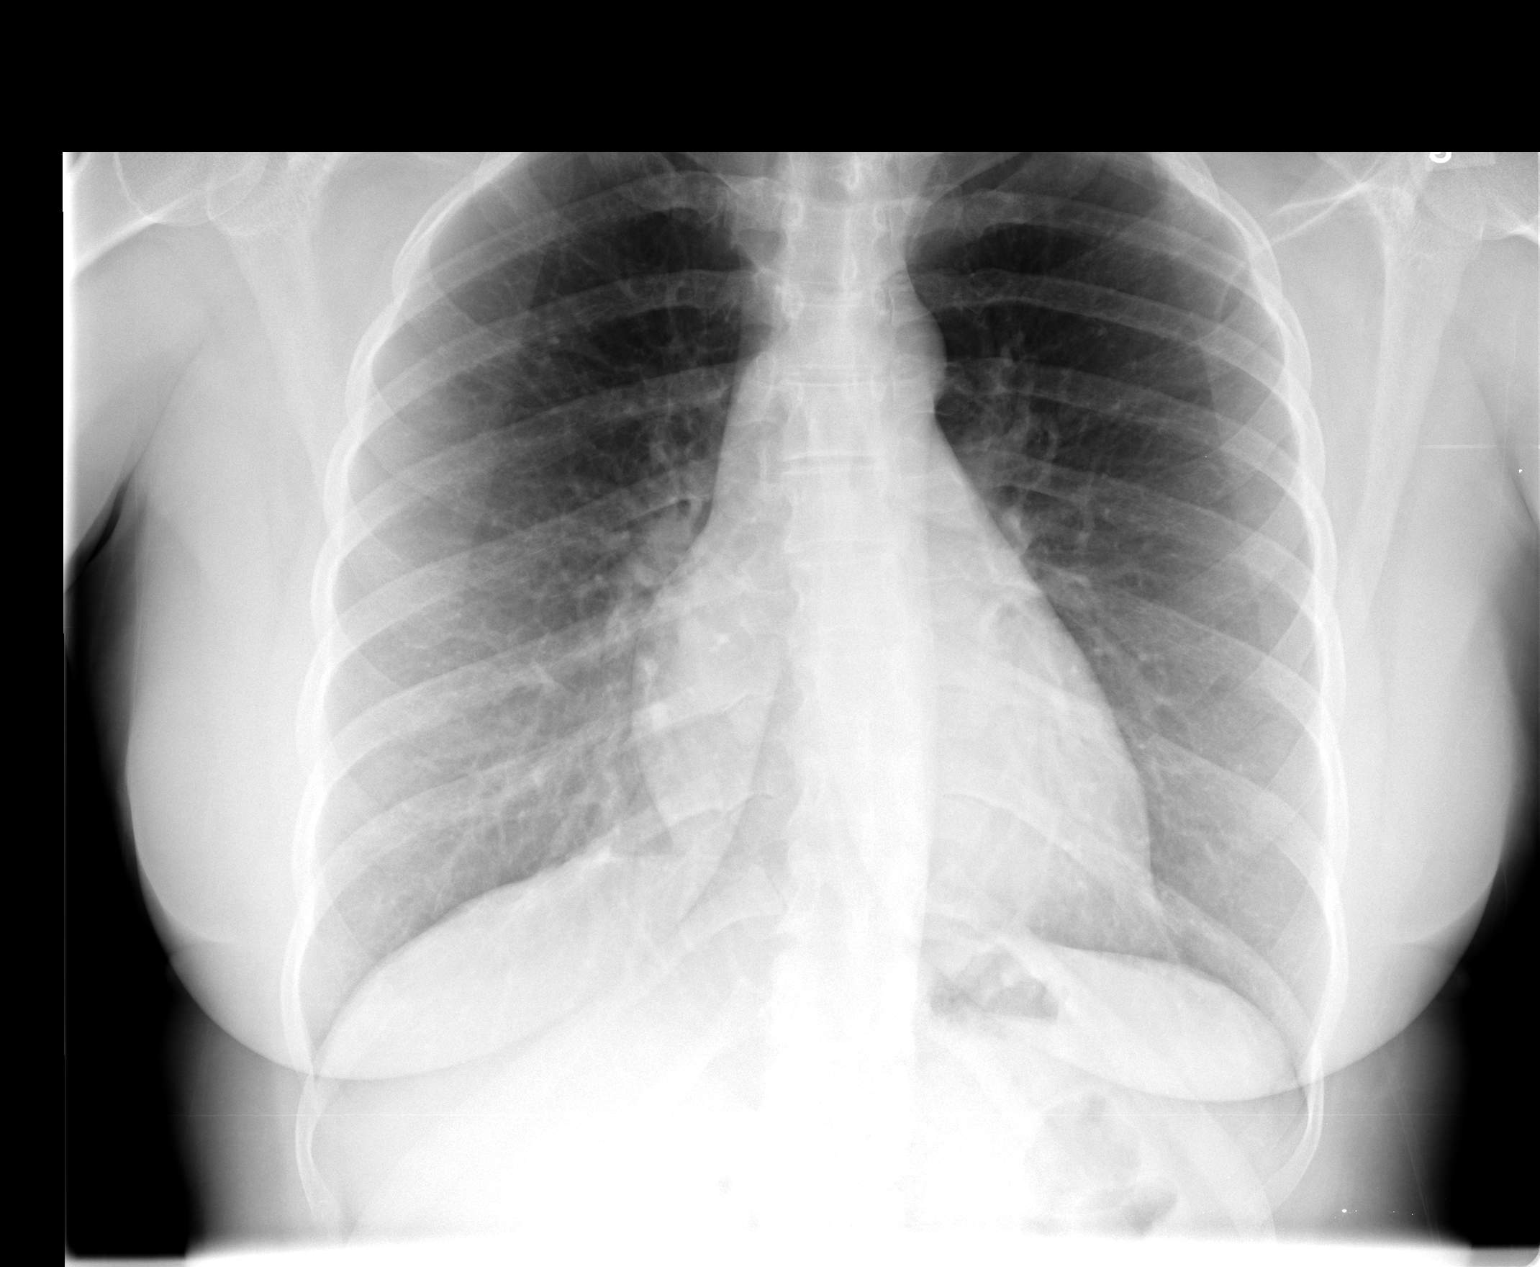

[view not recorded (2 of 2)]
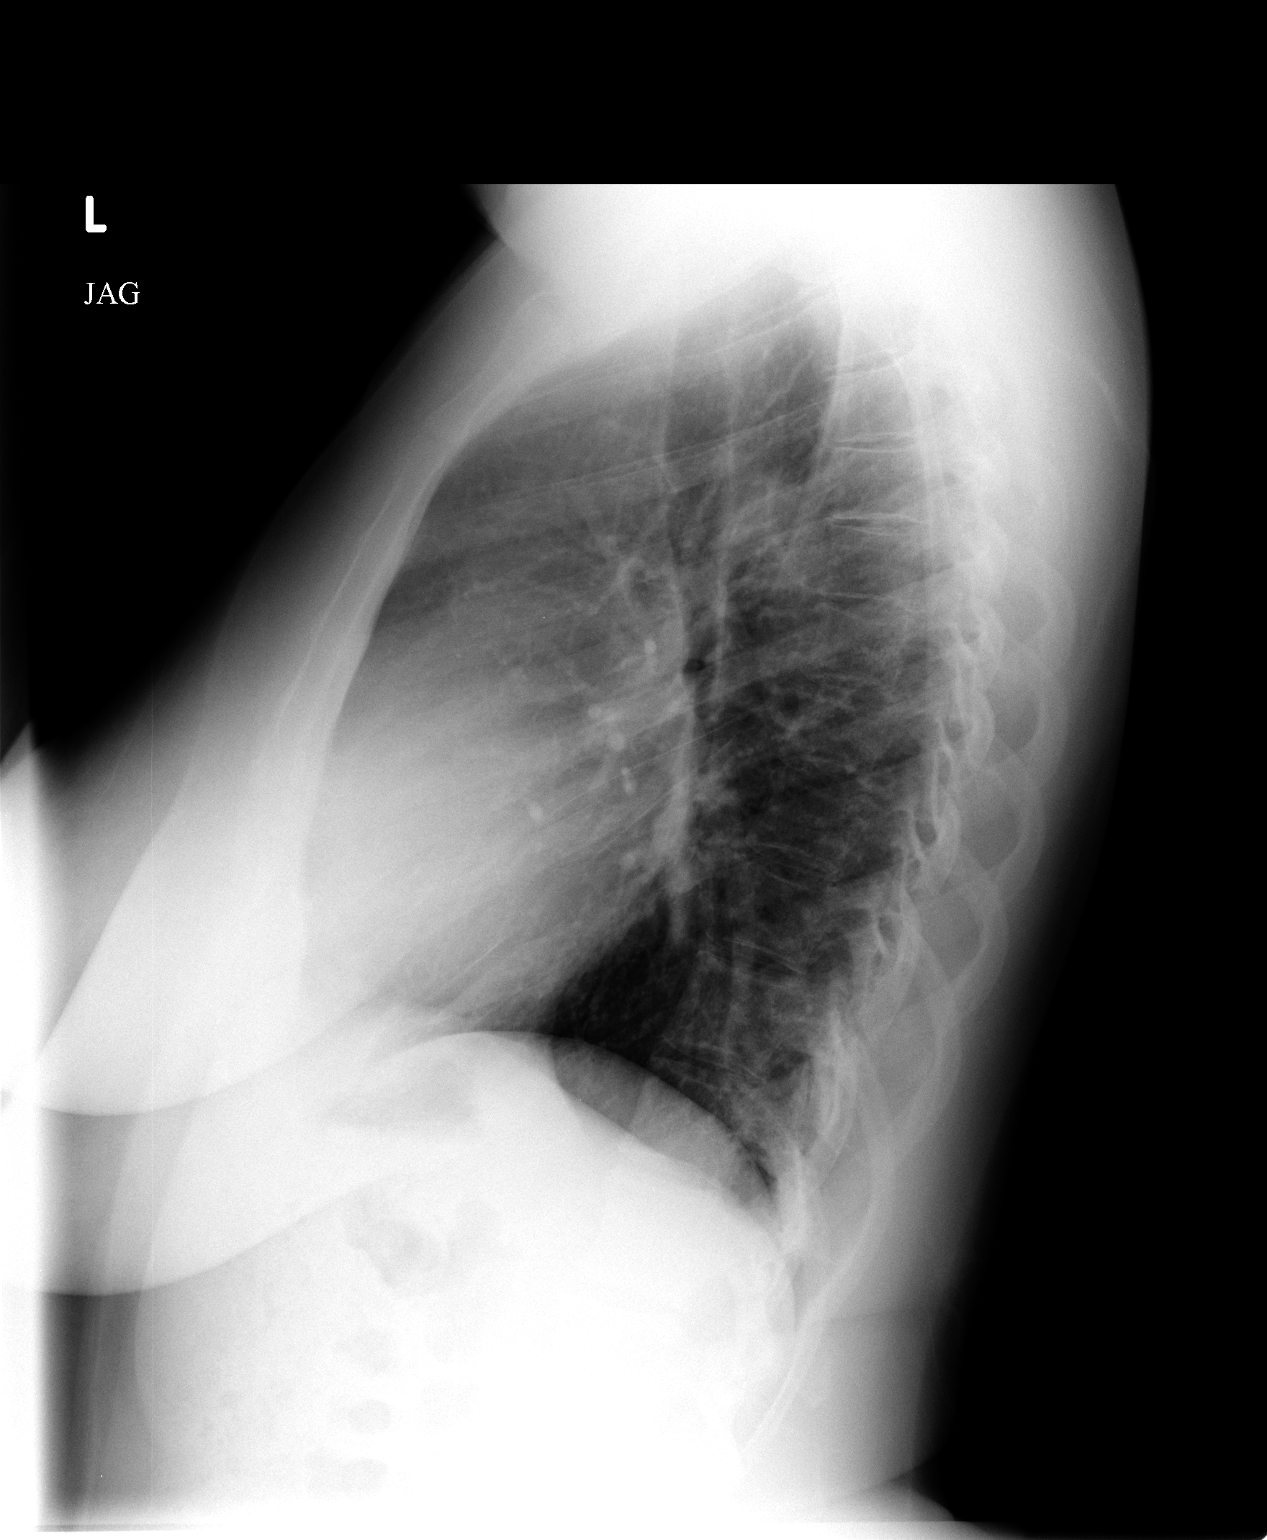

[2 of 2 positions shown; findings below may reference images not displayed]

FINDINGS: Cardiomediastinal silhouette is unremarkable. The lungs are clear
without pleural effusions or focal consolidations. Trachea projects
midline and there is no pneumothorax. Soft tissue planes and
included osseous structures are non-suspicious.
IMPRESSION: No acute cardiopulmonary process, stable appearance of the chest
from October 22, 2013.

  By: Naseem Garrett

## 2015-10-21 ENCOUNTER — Encounter: Payer: Self-pay | Admitting: Family Medicine

## 2015-10-21 ENCOUNTER — Ambulatory Visit (INDEPENDENT_AMBULATORY_CARE_PROVIDER_SITE_OTHER): Payer: 59 | Admitting: Family Medicine

## 2015-10-21 VITALS — BP 130/74 | HR 82 | Temp 98.1°F | Resp 16 | Ht 65.5 in | Wt 174.0 lb

## 2015-10-21 DIAGNOSIS — L910 Hypertrophic scar: Secondary | ICD-10-CM | POA: Diagnosis not present

## 2015-10-21 DIAGNOSIS — Z0000 Encounter for general adult medical examination without abnormal findings: Secondary | ICD-10-CM

## 2015-10-21 DIAGNOSIS — Z23 Encounter for immunization: Secondary | ICD-10-CM | POA: Diagnosis not present

## 2015-10-21 DIAGNOSIS — Z113 Encounter for screening for infections with a predominantly sexual mode of transmission: Secondary | ICD-10-CM | POA: Diagnosis not present

## 2015-10-21 LAB — URINALYSIS, MICROSCOPIC ONLY
CASTS: NONE SEEN [LPF]
CRYSTALS: NONE SEEN [HPF]
WBC, UA: NONE SEEN WBC/HPF (ref <=5)
Yeast: NONE SEEN [HPF]

## 2015-10-21 LAB — URINALYSIS, ROUTINE W REFLEX MICROSCOPIC
BILIRUBIN URINE: NEGATIVE
Glucose, UA: NEGATIVE
Ketones, ur: NEGATIVE
Leukocytes, UA: NEGATIVE
Nitrite: NEGATIVE
PROTEIN: NEGATIVE
Specific Gravity, Urine: 1.02 (ref 1.001–1.035)
pH: 6.5 (ref 5.0–8.0)

## 2015-10-21 LAB — CBC WITH DIFFERENTIAL/PLATELET
Basophils Absolute: 0 10*3/uL (ref 0.0–0.1)
Basophils Relative: 1 % (ref 0–1)
EOS PCT: 2 % (ref 0–5)
Eosinophils Absolute: 0.1 10*3/uL (ref 0.0–0.7)
HEMATOCRIT: 38.4 % (ref 36.0–46.0)
HEMOGLOBIN: 13 g/dL (ref 12.0–15.0)
LYMPHS ABS: 1.9 10*3/uL (ref 0.7–4.0)
Lymphocytes Relative: 43 % (ref 12–46)
MCH: 28.6 pg (ref 26.0–34.0)
MCHC: 33.9 g/dL (ref 30.0–36.0)
MCV: 84.6 fL (ref 78.0–100.0)
MPV: 10.6 fL (ref 8.6–12.4)
Monocytes Absolute: 0.3 10*3/uL (ref 0.1–1.0)
Monocytes Relative: 6 % (ref 3–12)
NEUTROS ABS: 2.2 10*3/uL (ref 1.7–7.7)
Neutrophils Relative %: 48 % (ref 43–77)
Platelets: 235 10*3/uL (ref 150–400)
RBC: 4.54 MIL/uL (ref 3.87–5.11)
RDW: 12.8 % (ref 11.5–15.5)
WBC: 4.5 10*3/uL (ref 4.0–10.5)

## 2015-10-21 LAB — COMPREHENSIVE METABOLIC PANEL
ALBUMIN: 4.1 g/dL (ref 3.6–5.1)
ALK PHOS: 71 U/L (ref 33–115)
ALT: 10 U/L (ref 6–29)
AST: 11 U/L (ref 10–30)
BILIRUBIN TOTAL: 0.4 mg/dL (ref 0.2–1.2)
BUN: 8 mg/dL (ref 7–25)
CALCIUM: 8.9 mg/dL (ref 8.6–10.2)
CO2: 24 mmol/L (ref 20–31)
Chloride: 103 mmol/L (ref 98–110)
Creat: 0.63 mg/dL (ref 0.50–1.10)
Glucose, Bld: 76 mg/dL (ref 70–99)
POTASSIUM: 4.2 mmol/L (ref 3.5–5.3)
Sodium: 136 mmol/L (ref 135–146)
TOTAL PROTEIN: 7.5 g/dL (ref 6.1–8.1)

## 2015-10-21 LAB — LIPID PANEL
Cholesterol: 192 mg/dL (ref 125–200)
HDL: 54 mg/dL (ref >=46)
LDL Cholesterol: 125 mg/dL (ref <130)
TRIGLYCERIDES: 67 mg/dL (ref <150)
Total CHOL/HDL Ratio: 3.6 Ratio (ref <=5.0)
VLDL: 13 mg/dL (ref <30)

## 2015-10-21 LAB — TSH: TSH: 1.659 u[IU]/mL (ref 0.350–4.500)

## 2015-10-21 MED ORDER — TRIAMCINOLONE ACETONIDE 0.1 % EX CREA: 1.0000 "application " | TOPICAL_CREAM | Freq: Two times a day (BID) | CUTANEOUS | Status: DC

## 2015-10-21 NOTE — Addendum Note (Signed)
Addended by: Phillips OdorSIX, Trayton Szabo H on: 10/21/2015 11:31 AM   Modules accepted: Orders

## 2015-10-21 NOTE — Progress Notes (Signed)
Patient ID: Shelley Vargas, female   DOB: 1976-04-02, 39 y.o.   MRN: [id number]   Subjective:    Patient ID: Shelley Vargas, female    DOB: 1976-04-02, 39 y.o.   MRN: [id number]  Patient presents for CPE  PAP Smear normal and UTD in July 2015. Would like STD check, including HIV test Due for mammogram Due for TDAP  Needs new referral to continue treatments for keloid on ear  Needs refill on eczema cream  Currently on menses declined pelvic exam- urine left for GC/Chlamydia check  Review Of Systems:  GEN- denies fatigue, fever, weight loss,weakness, recent illness HEENT- denies eye drainage, change in vision, nasal discharge, CVS- denies chest pain, palpitations RESP- denies SOB, cough, wheeze ABD- denies N/V, change in stools, abd pain GU- denies dysuria, hematuria, dribbling, incontinence MSK- denies joint pain, muscle aches, injury Neuro- denies headache, dizziness, syncope, seizure activity       Objective:    BP 130/74 mmHg  Pulse 82  Temp(Src) 98.1 F (36.7 C) (Oral)  Resp 16  Ht 5' 5.5" (1.664 m)  Wt 174 lb (78.926 kg)  BMI 28.50 kg/m2  LMP 10/19/2015 (Exact Date) GEN- NAD, alert and oriented x3 HEENT- PERRL, EOMI, non injected sclera, pink conjunctiva, MMM, oropharynx clear Neck- Supple, no thyromegaly CVS- RRR, no murmur RESP-CTAB ABD-NABS,soft,NT,ND Skin- Right earlobe- keloid, NT, mild dyshidrotic eczema lateral aspect of hands EXT- No edema Pulses- Radial, DP- 2+        Assessment & Plan:      Problem List Items Addressed This Visit    Routine general medical examination at a health care facility - Primary   Relevant Orders   CBC with Differential/Platelet   Comprehensive metabolic panel   Lipid panel   TSH    Other Visit Diagnoses    Screen for STD (sexually transmitted disease)        Relevant Orders    GC/chlamydia probe amp, urine    Urinalysis, Routine w reflex microscopic ([facility])    HIV antibody    RPR    HSV(herpes  smplx)abs-1+2(IgG+IgM)-bld       Note: This dictation was prepared with Dragon dictation along with smaller phrase technology. Any transcriptional errors that result from this process are unintentional.

## 2015-10-21 NOTE — Patient Instructions (Signed)
We will call with lab results Referral to dermatologist F/U 1 year or as needed

## 2015-10-21 NOTE — Assessment & Plan Note (Signed)
Referral back to dermatology 

## 2015-10-22 LAB — HIV ANTIBODY (ROUTINE TESTING W REFLEX): HIV 1&2 Ab, 4th Generation: NONREACTIVE

## 2015-10-22 LAB — RPR

## 2015-10-22 LAB — GC/CHLAMYDIA PROBE AMP, URINE
CHLAMYDIA, SWAB/URINE, PCR: NEGATIVE
GC Probe Amp, Urine: NEGATIVE

## 2015-10-26 LAB — HSV(HERPES SMPLX)ABS-I+II(IGG+IGM)-BLD
HSV 1 GLYCOPROTEIN G AB, IGG: 7.17 IV — AB
HSV 2 Glycoprotein G Ab, IgG: 13.22 IV — ABNORMAL HIGH
Herpes Simplex Vrs I&II-IgM Ab (EIA): 1.27 INDEX — ABNORMAL HIGH

## 2015-10-29 ENCOUNTER — Telehealth: Payer: Self-pay | Admitting: *Deleted

## 2015-10-29 ENCOUNTER — Encounter: Payer: Self-pay | Admitting: *Deleted

## 2015-10-29 NOTE — Telephone Encounter (Signed)
[facility] to Dr. 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

## 2015-11-04 ENCOUNTER — Encounter: Payer: Self-pay | Admitting: Family Medicine

## 2016-05-23 ENCOUNTER — Encounter: Payer: Self-pay | Admitting: Family Medicine

## 2016-05-23 ENCOUNTER — Other Ambulatory Visit: Payer: Self-pay | Admitting: Family Medicine

## 2016-05-23 ENCOUNTER — Ambulatory Visit (INDEPENDENT_AMBULATORY_CARE_PROVIDER_SITE_OTHER): Payer: BLUE CROSS/BLUE SHIELD | Admitting: Family Medicine

## 2016-05-23 VITALS — BP 130/68 | HR 74 | Temp 98.3°F | Resp 12 | Ht 65.5 in | Wt 193.0 lb

## 2016-05-23 DIAGNOSIS — R350 Frequency of micturition: Secondary | ICD-10-CM | POA: Diagnosis not present

## 2016-05-23 DIAGNOSIS — N898 Other specified noninflammatory disorders of vagina: Secondary | ICD-10-CM

## 2016-05-23 DIAGNOSIS — R635 Abnormal weight gain: Secondary | ICD-10-CM | POA: Diagnosis not present

## 2016-05-23 DIAGNOSIS — IMO0001 Reserved for inherently not codable concepts without codable children: Secondary | ICD-10-CM

## 2016-05-23 LAB — WET PREP FOR TRICH, YEAST, CLUE
Trich, Wet Prep: NONE SEEN
Yeast Wet Prep HPF POC: NONE SEEN

## 2016-05-23 LAB — URINALYSIS, ROUTINE W REFLEX MICROSCOPIC
Bilirubin Urine: NEGATIVE
GLUCOSE, UA: NEGATIVE
Ketones, ur: NEGATIVE
Leukocytes, UA: NEGATIVE
NITRITE: NEGATIVE
Protein, ur: NEGATIVE
Specific Gravity, Urine: 1.02 (ref 1.001–1.035)
pH: 6 (ref 5.0–8.0)

## 2016-05-23 LAB — URINALYSIS, MICROSCOPIC ONLY
Bacteria, UA: NONE SEEN [HPF]
CASTS: NONE SEEN [LPF]
Crystals: NONE SEEN [HPF]
WBC, UA: NONE SEEN WBC/HPF (ref <=5)
YEAST: NONE SEEN [HPF]

## 2016-05-23 NOTE — Patient Instructions (Signed)
We will call with  Lab results F/U pending results Work on diet, no SODA, no fast food  Goal back down to 170lbs

## 2016-05-23 NOTE — Progress Notes (Signed)
Patient ID: Shelley Vargas, female   DOB: Mar 29, 1976, 40 y.o.   MRN: [id number]   Subjective:    Patient ID: Shelley Vargas, female    DOB: Mar 29, 1976, 40 y.o.   MRN: [id number]  Patient presents for Urinary Frequency Patient here with urinary frequency for the past few months. She denies any actual dysuria no back pain or abdominal pain. She has had some mild vaginal discharge but minimal odor. No abnormal vaginal bleeding. She does admit to eating a lot of sweets and drinking a lot of soda she's gained 20 pounds in the past 6 months No family history of diabetes    Review Of Systems:  GEN- denies fatigue, fever, weight loss,weakness, recent illness HEENT- denies eye drainage, change in vision, nasal discharge, CVS- denies chest pain, palpitations RESP- denies SOB, cough, wheeze ABD- denies N/V, change in stools, abd pain GU- denies dysuria, hematuria, dribbling, incontinence MSK- denies joint pain, muscle aches, injury Neuro- denies headache, dizziness, syncope, seizure activity       Objective:    BP 130/68 mmHg  Pulse 74  Temp(Src) 98.3 F (36.8 C) (Oral)  Resp 12  Ht 5' 5.5" (1.664 m)  Wt 193 lb (87.544 kg)  BMI 31.62 kg/m2 GEN- NAD, alert and oriented x3 HEENT- PERRL, EOMI, non injected sclera, pink conjunctiva, MMM, oropharynx clear Neck- Supple, no thyromegaly CVS- RRR, no murmur RESP-CTAB ABD-NABS,soft,NT,ND, no CVA tenderness  GU- normal external genitalia, vaginal mucosa pink and moist, cervix visualized no growth, no blood form os, minimal thin clear discharge, no CMT, no ovarian masses, uterus normal size EXT- No edema Pulses- Radial, DP- 2+        Assessment & Plan:      Problem List Items Addressed This Visit    None    Visit Diagnoses    Frequency    -  Primary    with weight gain will check labs ensure not diabetic. No sign of UTI    Relevant Orders    Urinalysis, Routine w reflex microscopic ([facility])    Weight gain        Relevant  Orders    CBC with Differential/Platelet    Basic metabolic panel    TSH    Vaginal discharge        Wet prep done, treat pending on results, discussed avoiding fragrant soaps/body washes in vaginal region    Relevant Orders    WET PREP FOR TRICH, YEAST, CLUE       Note: This dictation was prepared with Dragon dictation along with smaller phrase technology. Any transcriptional errors that result from this process are unintentional.

## 2016-05-24 LAB — CBC WITH DIFFERENTIAL/PLATELET
Basophils Absolute: 0 cells/uL (ref 0–200)
Basophils Relative: 0 %
EOS PCT: 1 %
Eosinophils Absolute: 68 cells/uL (ref 15–500)
HCT: 38.4 % (ref 35.0–45.0)
HEMOGLOBIN: 12.5 g/dL (ref 12.0–15.0)
LYMPHS ABS: 1972 {cells}/uL (ref 850–3900)
Lymphocytes Relative: 29 %
MCH: 28.5 pg (ref 27.0–33.0)
MCHC: 32.6 g/dL (ref 32.0–36.0)
MCV: 87.5 fL (ref 80.0–100.0)
MONOS PCT: 3 %
MPV: 10.9 fL (ref 7.5–12.5)
Monocytes Absolute: 204 cells/uL (ref 200–950)
NEUTROS ABS: 4556 {cells}/uL (ref 1500–7800)
Neutrophils Relative %: 67 %
PLATELETS: 223 10*3/uL (ref 140–400)
RBC: 4.39 MIL/uL (ref 3.80–5.10)
RDW: 12.5 % (ref 11.0–15.0)
WBC: 6.8 10*3/uL (ref 3.8–10.8)

## 2016-05-24 LAB — BASIC METABOLIC PANEL
BUN: 8 mg/dL (ref 7–25)
CO2: 27 mmol/L (ref 20–31)
Calcium: 8.8 mg/dL (ref 8.6–10.2)
Chloride: 105 mmol/L (ref 98–110)
Creat: 0.75 mg/dL (ref 0.50–1.10)
GLUCOSE: 112 mg/dL — AB (ref 70–99)
POTASSIUM: 3.5 mmol/L (ref 3.5–5.3)
Sodium: 138 mmol/L (ref 135–146)

## 2016-05-24 LAB — TSH: TSH: 1.41 mIU/L

## 2016-05-26 LAB — HEMOGLOBIN A1C
Hgb A1c MFr Bld: 5.1 % (ref <5.7)
MEAN PLASMA GLUCOSE: 100 mg/dL

## 2016-06-05 IMAGING — DX DG SHOULDER 2+V*R*
3 series · 3 of 3 positions shown · non-contrast
Comparison: None.

CLINICAL DATA: Right shoulder pain after a fall last night while
running from a dog.

EXAM:
RIGHT RIBS AND CHEST - 3+ VIEW; RIGHT SHOULDER - 2+ VIEW

[shoulder grashey]
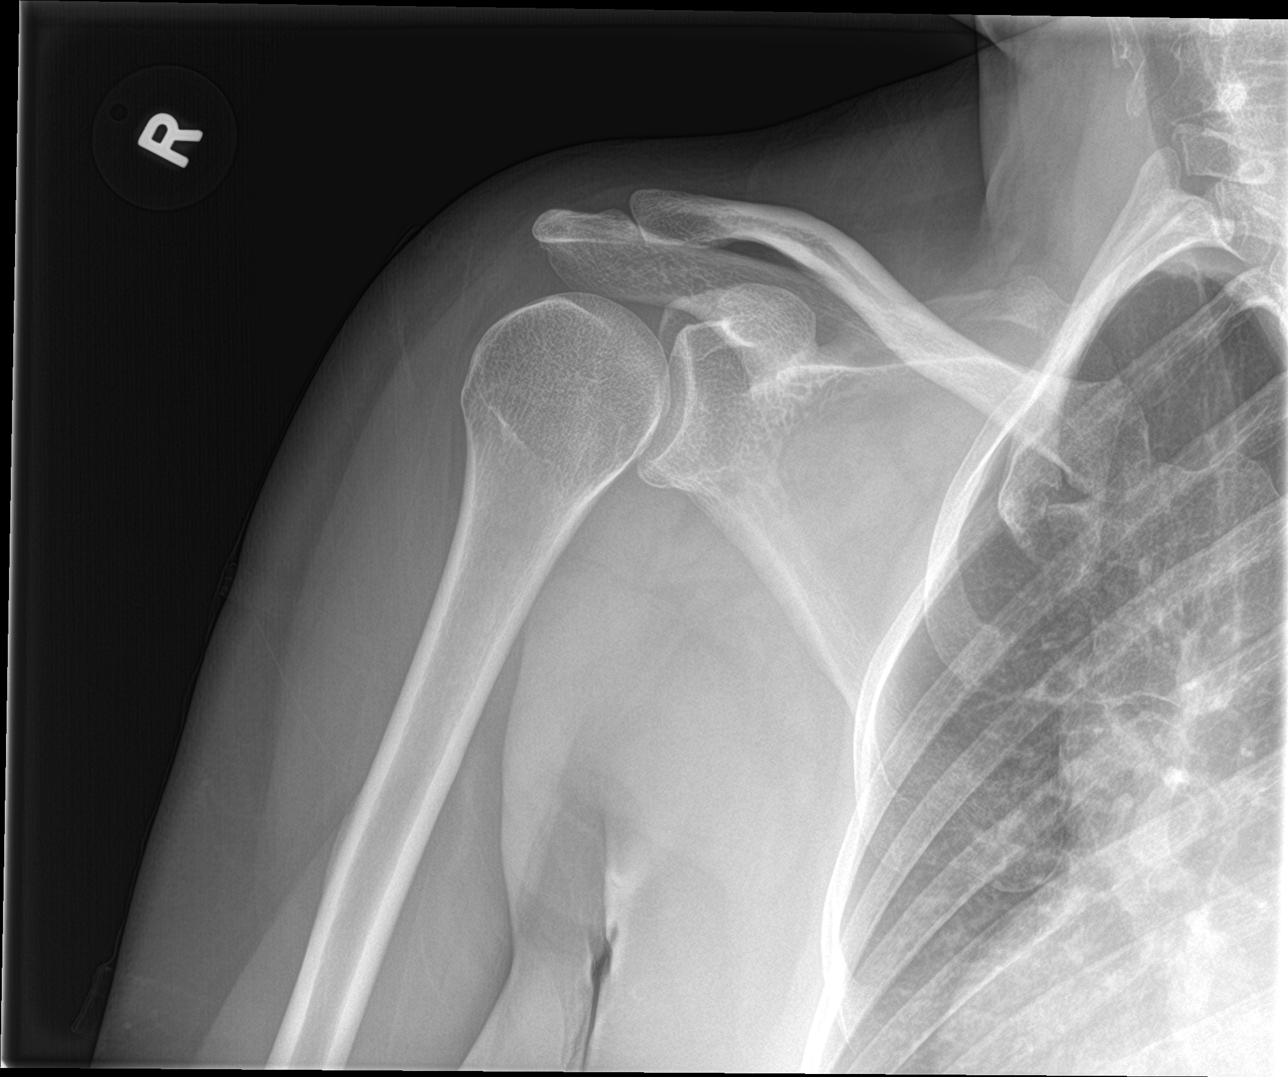

[shoulder y view]
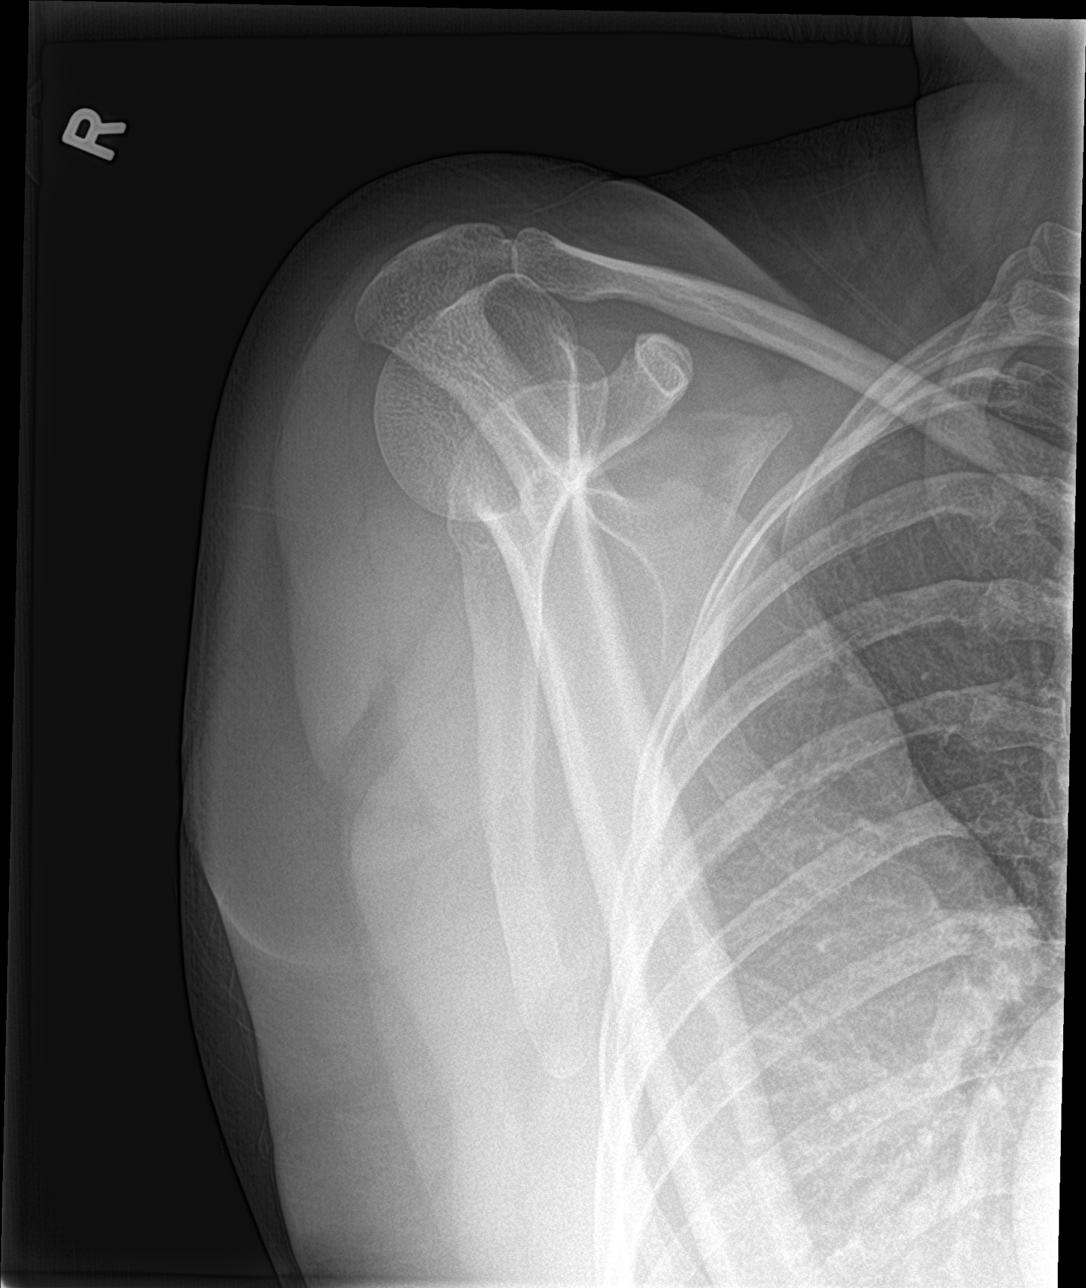

[shoulder axillary]
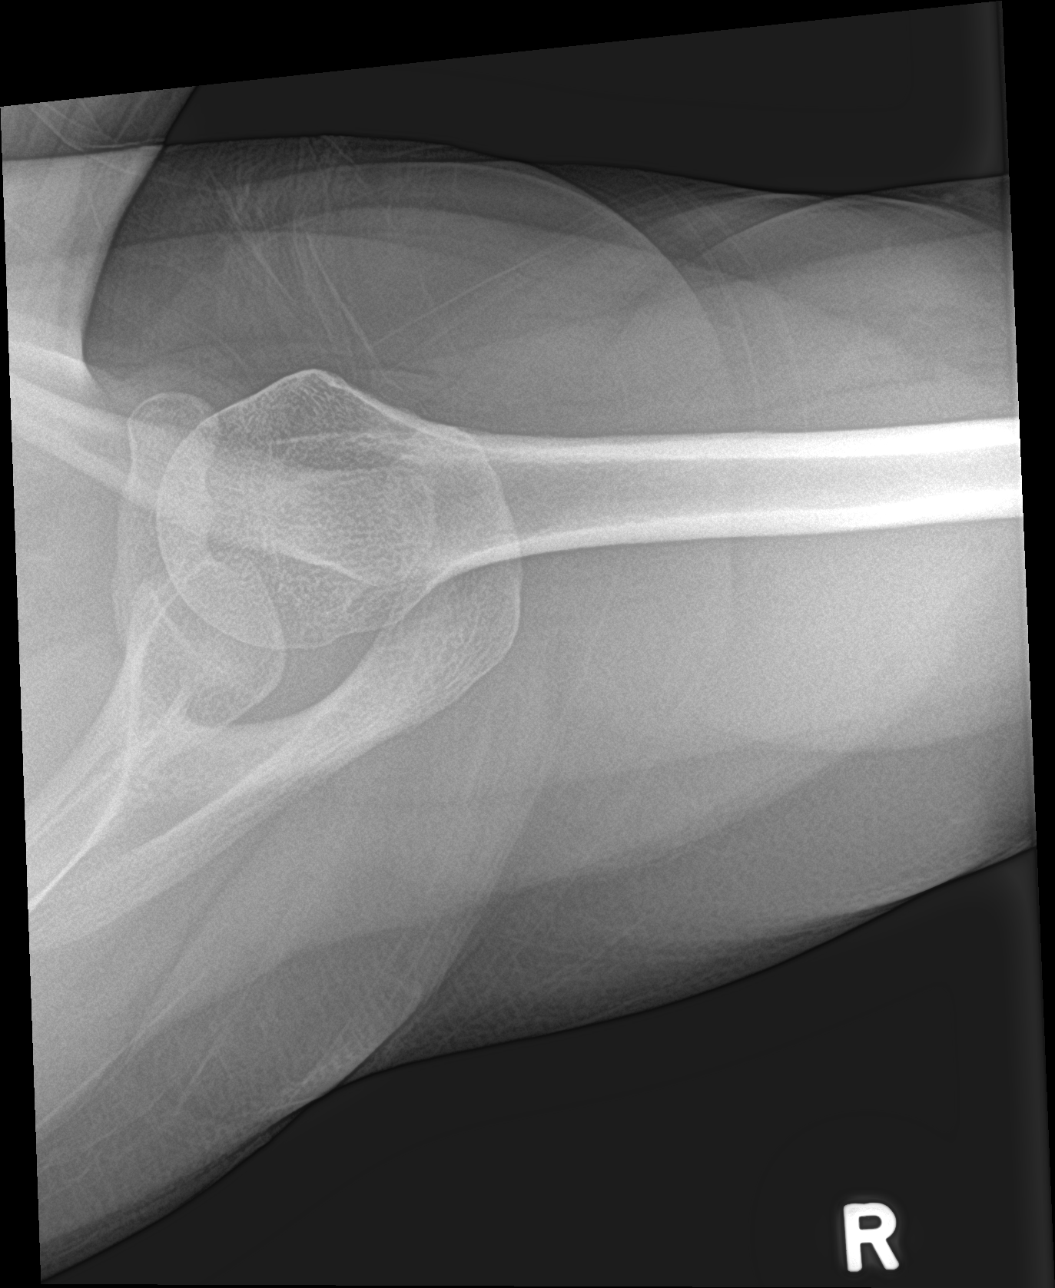

[3 of 3 positions shown; findings below may reference images not displayed]

FINDINGS: No fracture or other bone lesions are seen involving the ribs. There
is no evidence of pneumothorax or pleural effusion. Both lungs are
clear. Heart size and mediastinal contours are within normal limits.
IMPRESSION: Normal exam.

## 2016-11-30 ENCOUNTER — Emergency Department (HOSPITAL_COMMUNITY)
Admission: EM | Admit: 2016-11-30 | Discharge: 2016-11-30 | Disposition: A | Discharge status: Home / Self Care | Payer: BLUE CROSS/BLUE SHIELD | Attending: Emergency Medicine | Admitting: Emergency Medicine

## 2016-11-30 ENCOUNTER — Emergency Department (HOSPITAL_COMMUNITY): Payer: BLUE CROSS/BLUE SHIELD

## 2016-11-30 ENCOUNTER — Encounter (HOSPITAL_COMMUNITY): Payer: Self-pay | Admitting: Emergency Medicine

## 2016-11-30 DIAGNOSIS — R05 Cough: Secondary | ICD-10-CM | POA: Diagnosis present

## 2016-11-30 DIAGNOSIS — J40 Bronchitis, not specified as acute or chronic: Secondary | ICD-10-CM | POA: Insufficient documentation

## 2016-11-30 DIAGNOSIS — Z79899 Other long term (current) drug therapy: Secondary | ICD-10-CM | POA: Insufficient documentation

## 2016-11-30 MED ORDER — BENZONATATE 100 MG PO CAPS: 100.0000 mg | ORAL_CAPSULE | Freq: Three times a day (TID) | ORAL | 0 refills | Status: DC

## 2016-11-30 MED ORDER — AZITHROMYCIN 250 MG PO TABS: ORAL_TABLET | ORAL | 0 refills | Status: DC

## 2016-11-30 MED ORDER — ACETAMINOPHEN 325 MG PO TABS
650.0000 mg | ORAL_TABLET | Freq: Once | ORAL | Status: AC
  Administered 2016-11-30: 650 mg via ORAL
  Filled 2016-11-30: qty 2

## 2016-11-30 MED ORDER — PREDNISONE 20 MG PO TABS: ORAL_TABLET | ORAL | 0 refills | Status: DC

## 2016-11-30 NOTE — ED Notes (Signed)
Pt returned from xray

## 2016-11-30 NOTE — ED Provider Notes (Signed)
[facility]-EMERGENCY DEPT Provider Note   CSN: [id number] Arrival date & time: 11/30/16  1012     History   Chief Complaint Chief Complaint  Patient presents with  . Cough    HPI Shelley Vargas is a 41 y.o. female.  HPI   41 year old female with history of recurrent bronchitis presenting for evaluation of cough. Patient report for the past 5-6 days she has had nasal congestion, sneezing, nonproductive cough, strep throat irritation, ear discomfort, chills, decrease in appetite and chest congestion. Symptom has been persistent despite taking DayQuil, NyQuil, Alka-Seltzer, and Robitussin at home. She is a former smoker but no one else at home this is currently smoking. She denies nausea vomiting or diarrhea or rash. No shortness of breath.  Past Medical History:  Diagnosis Date  . Bronchitis   . Bronchitis   . Eczema   . Iron deficiency anemia     Patient Active Problem List   Diagnosis Date Noted  . Routine general medical examination at a health care facility 06/13/2014  . Keloid 01/07/2014  . Intertrigo 01/07/2014  . Eczema 05/28/2013  . Vaginitis 08/28/2012  . Hand dermatitis 08/28/2012  . Tobacco user 07/11/2011  . Overweight(278.02) 07/11/2011  . Iron deficiency anemia 07/11/2011    Past Surgical History:  Procedure Laterality Date  . rods in left leg      OB History    Gravida Para Term Preterm AB Living   5 3 3   2      SAB TAB Ectopic Multiple Live Births   1 1             Home Medications    Prior to Admission medications   Not on File    Family History Family History  Problem Relation Age of Onset  . Hypertension Mother   . Hypertension Father     Social History Social History  Substance Use Topics  . Smoking status: Never Smoker  . Smokeless tobacco: Never Used  . Alcohol use 0.6 oz/week    1 Glasses of wine per week     Comment: occasioanl     Allergies   Penicillins   Review of Systems Review of Systems  All other systems  reviewed and are negative.    Physical Exam Updated Vital Signs Ht 5\' 4"  (1.626 m)   Wt 86.2 kg   BMI 32.61 kg/m   Physical Exam  Constitutional: She appears well-developed and well-nourished. No distress.  HENT:  Head: Atraumatic.  Right Ear: External ear normal.  Left Ear: External ear normal.  Nose: Nose normal.  Mouth/Throat: Oropharynx is clear and moist.  Eyes: Conjunctivae are normal.  Neck: Normal range of motion. Neck supple.  No nuchal rigidity  Cardiovascular: Normal rate and regular rhythm.   Pulmonary/Chest: Effort normal and breath sounds normal.  Faint scattered rhonchi without wheezes, rales  Abdominal: Soft. There is no tenderness.  Lymphadenopathy:    She has no cervical adenopathy.  Neurological: She is alert.  Skin: No rash noted.  Psychiatric: She has a normal mood and affect.  Nursing note and vitals reviewed.    ED Treatments / Results  Labs (all labs ordered are listed, but only abnormal results are displayed) Labs Reviewed - No data to display  EKG  EKG Interpretation None       Radiology Dg Chest 2 View  Result Date: 11/30/2016 CLINICAL DATA:  Cough.  Fever. EXAM: CHEST  2 VIEW COMPARISON:  03/16/2015 FINDINGS: The heart size and mediastinal contours are  within normal limits. Both lungs are clear. The visualized skeletal structures are unremarkable. IMPRESSION: No active cardiopulmonary disease. Electronically Signed   By: Gaylyn Rong M.D.   On: 11/30/2016 11:00    Procedures Procedures (including critical care time)  Medications Ordered in ED Medications - No data to display   Initial Impression / Assessment and Plan / ED Course  I have reviewed the triage vital signs and the nursing notes.  Pertinent labs & imaging results that were available during my care of the patient were reviewed by me and considered in my medical decision making (see chart for details).  Clinical Course     BP 156/92 (BP Location: Left Arm)    Pulse 85   Temp 99.7 F (37.6 C) (Oral)   Resp 18   Ht 5\' 4"  (1.626 m)   Wt 86.2 kg   SpO2 97%   BMI 32.61 kg/m    Final Clinical Impressions(s) / ED Diagnoses   Final diagnoses:  Bronchitis    New Prescriptions New Prescriptions   AZITHROMYCIN (ZITHROMAX Z-PAK) 250 MG TABLET    2 po day one, then 1 daily x 4 days   BENZONATATE (TESSALON) 100 MG CAPSULE    Take 1 capsule (100 mg total) by mouth every 8 (eight) hours.   PREDNISONE (DELTASONE) 20 MG TABLET    2 tabs po daily x 4 days   10:29 AM Patient here with cold symptoms were nearly a week. Will obtain chest x-ray to rule out pneumonia. She is afebrile with stable normal vital sign.  11:23 AM CXR without acute finding.  However, given hx of recurrent bronchitis, her sxs not improved with OTC medication, and pt does have scattered rhonchi I will prescribe prednisone, Zpak and cough medication to treat her sxs.  Return precaution given.     Fayrene Helper, PA-C 11/30/16 1125    Donnetta Hutching, MD 12/03/16 [id number]

## 2016-11-30 NOTE — ED Notes (Signed)
Pt taken to xray 

## 2016-11-30 NOTE — ED Triage Notes (Signed)
Started last Friday. Coughing,. Congested non productive. No fever. Pt states taking OTC medications

## 2017-01-16 ENCOUNTER — Ambulatory Visit: Payer: BLUE CROSS/BLUE SHIELD | Admitting: Physician Assistant

## 2017-01-18 ENCOUNTER — Ambulatory Visit (INDEPENDENT_AMBULATORY_CARE_PROVIDER_SITE_OTHER): Payer: BLUE CROSS/BLUE SHIELD | Admitting: Family Medicine

## 2017-01-18 ENCOUNTER — Encounter: Payer: Self-pay | Admitting: Family Medicine

## 2017-01-18 VITALS — BP 122/74 | HR 66 | Temp 98.4°F | Resp 12 | Ht 65.5 in | Wt 193.0 lb

## 2017-01-18 DIAGNOSIS — L309 Dermatitis, unspecified: Secondary | ICD-10-CM

## 2017-01-18 DIAGNOSIS — B3731 Acute candidiasis of vulva and vagina: Secondary | ICD-10-CM

## 2017-01-18 DIAGNOSIS — B373 Candidiasis of vulva and vagina: Secondary | ICD-10-CM | POA: Diagnosis not present

## 2017-01-18 DIAGNOSIS — N938 Other specified abnormal uterine and vaginal bleeding: Secondary | ICD-10-CM

## 2017-01-18 LAB — URINALYSIS, ROUTINE W REFLEX MICROSCOPIC
BILIRUBIN URINE: NEGATIVE
GLUCOSE, UA: NEGATIVE
Ketones, ur: NEGATIVE
LEUKOCYTES UA: NEGATIVE
Nitrite: NEGATIVE
Protein, ur: NEGATIVE
Specific Gravity, Urine: 1.025 (ref 1.001–1.035)
pH: 6 (ref 5.0–8.0)

## 2017-01-18 LAB — URINALYSIS, MICROSCOPIC ONLY
BACTERIA UA: NONE SEEN [HPF]
Casts: NONE SEEN [LPF]
Crystals: NONE SEEN [HPF]
RBC / HPF: NONE SEEN RBC/HPF (ref <=2)
WBC, UA: NONE SEEN WBC/HPF (ref <=5)

## 2017-01-18 LAB — WET PREP FOR TRICH, YEAST, CLUE
Clue Cells Wet Prep HPF POC: NONE SEEN
Trich, Wet Prep: NONE SEEN

## 2017-01-18 LAB — PREGNANCY, URINE: Preg Test, Ur: NEGATIVE

## 2017-01-18 MED ORDER — CLOTRIMAZOLE 1 % EX CREA: 1.0000 "application " | TOPICAL_CREAM | Freq: Two times a day (BID) | CUTANEOUS | 0 refills | Status: DC

## 2017-01-18 MED ORDER — NORETHINDRONE ACET-ETHINYL EST 1.5-30 MG-MCG PO TABS: 1.0000 | ORAL_TABLET | Freq: Every day | ORAL | 11 refills | Status: DC

## 2017-01-18 MED ORDER — FLUCONAZOLE 150 MG PO TABS: ORAL_TABLET | ORAL | 0 refills | Status: DC

## 2017-01-18 MED ORDER — CLOBETASOL PROP EMOLLIENT BASE 0.05 % EX CREA: TOPICAL_CREAM | CUTANEOUS | 2 refills | Status: DC

## 2017-01-18 NOTE — Patient Instructions (Addendum)
Schedule Physical for July We will call with lab results

## 2017-01-18 NOTE — Progress Notes (Signed)
   Subjective:    Patient ID: Shelley Vargas, female    DOB: July 31, 1976, 41 y.o.   MRN: [id number]  Patient presents for Vaginal Irritation (outer labia itching, irritation- denies discharge- has been on ABTx) and Skin Irritation (reports generalized itching- denies new soaps, detergent, etc)  Pt here with Vaginal itching all discharge for the past few weeks. She was on antibiotic in early January for bronchitis. She states that she hasn't taken a bath with little alcohol in Epson salt to try to reduce germs from her workplace to reduce illness. This is a partially irritated her skin more she's been itching more and has some patches of eczema that come up. He denies any abdominal pain no nausea vomiting or diarrhea.  He has had heavy menstrual cycles as well as past few months. She would like to try birth control to see if she can help with this that she's been getting large clots. She did have a cycle that lasted almost 2 weeks.    Review Of Systems:  GEN- denies fatigue, fever, weight loss,weakness, recent illness HEENT- denies eye drainage, change in vision, nasal discharge, CVS- denies chest pain, palpitations RESP- denies SOB, cough, wheeze ABD- denies N/V, change in stools, abd pain GU- denies dysuria, hematuria, dribbling, incontinence MSK- denies joint pain, muscle aches, injury Neuro- denies headache, dizziness, syncope, seizure activity       Objective:    BP 122/74   Pulse 66   Temp 98.4 F (36.9 C) (Oral)   Resp 12   Ht 5' 5.5" (1.664 m)   Wt 193 lb (87.5 kg)   LMP 12/27/2016 Comment: heavy menses  SpO2 99%   BMI 31.63 kg/m  GEN- NAD, alert and oriented, ABD-NABS,SOFT,NT,ND GU- normal external genitalia, erythema of labia, vaginal mucosa pink and moist, cervix visualized no growth, no blood form os, + thick  white discharge, no CMT, no ovarian masses, uterus normal size Skin- ezema on rash on bilat hands, elbows, dry patches on back, no patches on legs         Assessment & Plan:      Problem List Items Addressed This Visit    Eczema    Give clobetasol, advised to stop the Alcohol baths Use thick moisturizing lotion       Other Visit Diagnoses    Vaginal candidiasis    -  Primary   Treat with diflucan and clotrimazole   Relevant Medications   fluconazole (DIFLUCAN) 150 MG tablet   clotrimazole (LOTRIMIN) 1 % cream   Other Relevant Orders   WET PREP FOR TRICH, YEAST, CLUE (Completed)   DUB (dysfunctional uterine bleeding)       U preg neg, to help with heavy menses trial of loestrin OCP   Relevant Orders   Urinalysis, Routine w reflex microscopic (Completed)   Pregnancy, urine (Completed)      Note: This dictation was prepared with Dragon dictation along with smaller phrase technology. Any transcriptional errors that result from this process are unintentional.

## 2017-01-18 NOTE — Assessment & Plan Note (Addendum)
Give clobetasol, advised to stop the Alcohol baths Use thick moisturizing lotion

## 2017-02-09 ENCOUNTER — Telehealth: Payer: Self-pay | Admitting: Family Medicine

## 2017-02-09 DIAGNOSIS — N926 Irregular menstruation, unspecified: Secondary | ICD-10-CM

## 2017-02-09 NOTE — Telephone Encounter (Signed)
Pt is still having irregular periods after still taking birthcontrol wants to know what options she has and wants to be referred sent to a specialist.

## 2017-02-09 NOTE — Telephone Encounter (Signed)
MD please advise

## 2017-02-10 NOTE — Telephone Encounter (Signed)
Send her to GYN - for irregular menses kep taking the birth control for now

## 2017-02-10 NOTE — Telephone Encounter (Signed)
Referral orders placed

## 2017-02-13 ENCOUNTER — Encounter (HOSPITAL_COMMUNITY): Payer: Self-pay | Admitting: Emergency Medicine

## 2017-02-13 ENCOUNTER — Emergency Department (HOSPITAL_COMMUNITY)
Admission: EM | Admit: 2017-02-13 | Discharge: 2017-02-13 | Disposition: A | Discharge status: Home / Self Care | Payer: BLUE CROSS/BLUE SHIELD | Attending: Emergency Medicine | Admitting: Emergency Medicine

## 2017-02-13 ENCOUNTER — Telehealth: Payer: Self-pay | Admitting: Family Medicine

## 2017-02-13 DIAGNOSIS — N939 Abnormal uterine and vaginal bleeding, unspecified: Secondary | ICD-10-CM | POA: Diagnosis not present

## 2017-02-13 LAB — CBC WITH DIFFERENTIAL/PLATELET
BASOS ABS: 0 10*3/uL (ref 0.0–0.1)
BASOS PCT: 0 %
Eosinophils Absolute: 0.1 10*3/uL (ref 0.0–0.7)
Eosinophils Relative: 1 %
HCT: 37.5 % (ref 36.0–46.0)
HEMOGLOBIN: 12.5 g/dL (ref 12.0–15.0)
Lymphocytes Relative: 26 %
Lymphs Abs: 1.8 10*3/uL (ref 0.7–4.0)
MCH: 28.6 pg (ref 26.0–34.0)
MCHC: 33.3 g/dL (ref 30.0–36.0)
MCV: 85.8 fL (ref 78.0–100.0)
Monocytes Absolute: 0.4 10*3/uL (ref 0.1–1.0)
Monocytes Relative: 6 %
Neutro Abs: 4.6 10*3/uL (ref 1.7–7.7)
Neutrophils Relative %: 67 %
Platelets: 312 10*3/uL (ref 150–400)
RBC: 4.37 MIL/uL (ref 3.87–5.11)
RDW: 12.3 % (ref 11.5–15.5)
WBC: 6.9 10*3/uL (ref 4.0–10.5)

## 2017-02-13 LAB — BASIC METABOLIC PANEL
ANION GAP: 6 (ref 5–15)
BUN: 11 mg/dL (ref 6–20)
CHLORIDE: 106 mmol/L (ref 101–111)
CO2: 25 mmol/L (ref 22–32)
CREATININE: 0.69 mg/dL (ref 0.44–1.00)
Calcium: 8.8 mg/dL — ABNORMAL LOW (ref 8.9–10.3)
GFR calc non Af Amer: >60 mL/min (ref >60)
Glucose, Bld: 76 mg/dL (ref 65–99)
Potassium: 3.5 mmol/L (ref 3.5–5.1)
Sodium: 137 mmol/L (ref 135–145)

## 2017-02-13 MED ORDER — NORGESTIMATE-ETH ESTRADIOL 0.25-35 MG-MCG PO TABS: 1.0000 | ORAL_TABLET | Freq: Every day | ORAL | 1 refills | Status: DC

## 2017-02-13 MED ORDER — NORGESTIMATE-ETH ESTRADIOL 0.25-35 MG-MCG PO TABS: 1.0000 | ORAL_TABLET | Freq: Every day | ORAL | 0 refills | Status: DC

## 2017-02-13 NOTE — Telephone Encounter (Incomplete)
Tell her to take 2 of the birth control pills for 1 week,

## 2017-02-13 NOTE — Telephone Encounter (Signed)
Pt wants to know if she can get Magistral.

## 2017-02-13 NOTE — Discharge Instructions (Signed)
You have received 2 prescriptions for a medication called Sprintec. This is an oral contraceptive. The first prescription is for one package. This one package he should take 3 pills a day for one week then throw the rest away. This should help with your uterine bleeding. It should cease during this time. After this week take one week off and during this time and will likely have a regular period. After the one week off without medication get the other prescription filled which is the same medication. Then start taking it once daily as instructed on the package. This may cause a little bit of nausea when you're taking 3 a day if so you may back down to twice a day. Please follow-up with her gynecologist in 1-2 weeks for further evaluation and management of your vaginal bleeding. °

## 2017-02-13 NOTE — ED Provider Notes (Signed)
Emergency Department Provider Note   I have reviewed the triage vital signs and the nursing notes.   HISTORY  Chief Complaint Vaginal Bleeding   HPI Shelley Vargas is a 41 y.o. female with PMH of iron deficiency anemia and history of tubal ligation presents to the emergency department for intermittent, heavy vaginal bleeding for the past 3 weeks. Prior to this she had regular menstrual periods. She saw her primary care physician who started her on birth control which seemed to make the symptoms no better or slightly worse. She describes intermittent bright red bleeding with heavy amounts of clotting. She has lower abdominal/pelvic cramps and cramping pain that radiates to her back bilaterally. No fainting episodes. No difficulty breathing or chest pain. No associated nausea, vomiting, diarrhea.  Past Medical History:  Diagnosis Date  . Bronchitis   . Bronchitis   . Eczema   . Iron deficiency anemia     Patient Active Problem List   Diagnosis Date Noted  . Routine general medical examination at a health care facility 06/13/2014  . Keloid 01/07/2014  . Intertrigo 01/07/2014  . Eczema 05/28/2013  . Hand dermatitis 08/28/2012  . Tobacco user 07/11/2011  . Overweight(278.02) 07/11/2011  . Iron deficiency anemia 07/11/2011    Past Surgical History:  Procedure Laterality Date  . rods in left leg      Current Outpatient Rx  . Order #: [id number] Class: Normal  . Order #: [id number] Class: Normal  . Order #: [id number] Class: Historical Med  . Order #: [id number] Class: Normal  . Order #: [id number] Class: Normal  . Order #: [id number] Class: Print  . Order #: [id number] Class: Print    Allergies Penicillins  Family History  Problem Relation Age of Onset  . Hypertension Mother   . Hypertension Father     Social History Social History  Substance Use Topics  . Smoking status: Never Smoker  . Smokeless tobacco: Never Used  . Alcohol use 0.6 oz/week    1 Glasses of wine per  week     Comment: occasioanl    Review of Systems  10-point ROS otherwise negative.  ____________________________________________   PHYSICAL EXAM:  VITAL SIGNS: ED Triage Vitals [02/13/17 1159]  Enc Vitals Group     BP (!) 152/64     Pulse Rate 67     Resp 18     Temp 98.9 F (37.2 C)     Temp Source Oral     SpO2 100 %     Weight 189 lb (85.7 kg)     Height 5\' 6"  (1.676 m)     Pain Score 10   Constitutional: Alert and oriented. Well appearing and in no acute distress. Eyes: Conjunctivae are normal.  Head: Atraumatic. Nose: No congestion/rhinnorhea. Mouth/Throat: Mucous membranes are moist.  Neck: No stridor. Cardiovascular: Normal rate, regular rhythm. Good peripheral circulation. Grossly normal heart sounds.   Respiratory: Normal respiratory effort.  No retractions. Lungs CTAB. Gastrointestinal: Soft and nontender. No distention.  Genitourinary: Mild dark red vaginal bleeding. Closed cervical. No masses, CMT, or adnexal tenderness on bimanual exam.  Musculoskeletal: No lower extremity tenderness nor edema. No gross deformities of extremities. Neurologic:  Normal speech and language. No gross focal neurologic deficits are appreciated.  Skin:  Skin is warm, dry and intact. No rash noted. Psychiatric: Mood and affect are normal. Speech and behavior are normal.  ____________________________________________   LABS (all labs ordered are listed, but only abnormal results are displayed)  Labs Reviewed  BASIC METABOLIC PANEL - Abnormal; Notable  for the following:       Result Value   Calcium 8.8 (*)    All other components within normal limits  CBC WITH DIFFERENTIAL/PLATELET   ____________________________________________  RADIOLOGY  None ____________________________________________   PROCEDURES  Procedure(s) performed:   Procedures  None ____________________________________________   INITIAL IMPRESSION / ASSESSMENT AND PLAN / ED COURSE  Pertinent  labs & imaging results that were available during my care of the patient were reviewed by me and considered in my medical decision making (see chart for details).  Patient resents to the emergency room in for evaluation of intermittent/heavy vaginal bleeding for the past 3 weeks. Lab work performed in triage is unremarkable. The patient has no hypotension, tachycardia, other signs of hypovolemic shock. She has a history of tubal ligation. Plan for pelvic exam and reassessment. Will likely start on progestin. She's been referred to OB/GYN and has an appointment on April 6 of this year. No history of fibroid or endometriosis.   Labs reviewed and largely unremarkable. Will start on Sprintec and have her follow up with OB/Gyn as scheduled for further w/u. Discussed return precautions in detail.   At this time, I do not feel there is any life-threatening condition present. I have reviewed and discussed all results (EKG, imaging, lab, urine as appropriate), exam findings with patient. I have reviewed nursing notes and appropriate previous records.  I feel the patient is safe to be discharged home without further emergent workup. Discussed usual and customary return precautions. Patient and family (if present) verbalize understanding and are comfortable with this plan.  Patient will follow-up with their primary care provider. If they do not have a primary care provider, information for follow-up has been provided to them. All questions have been answered.   ____________________________________________  FINAL CLINICAL IMPRESSION(S) / ED DIAGNOSES  Final diagnoses:  Vaginal bleeding     MEDICATIONS GIVEN DURING THIS VISIT:  None  NEW OUTPATIENT MEDICATIONS STARTED DURING THIS VISIT:  Discharge Medication List as of 02/13/2017  6:48 PM    START taking these medications   Details  !! norgestimate-ethinyl estradiol (SPRINTEC 28) 0.25-35 MG-MCG tablet Take 1 tablet by mouth daily., Starting Mon  02/13/2017, Print    !! norgestimate-ethinyl estradiol (SPRINTEC 28) 0.25-35 MG-MCG tablet Take 1 tablet by mouth daily., Starting Mon 02/13/2017, Print     !! - Potential duplicate medications found. Please discuss with provider.      Note:  This document was prepared using Dragon voice recognition software and may include unintentional dictation errors.  Alona Bene, MD Emergency Medicine   Maia Plan, MD 02/14/17 1140

## 2017-02-13 NOTE — Telephone Encounter (Signed)
She can double her OCP for 1 week, and going back to 1 pill a day afterwards Needs GYN appt to see cause of her bleeding. There is not much the ER can do

## 2017-02-13 NOTE — ED Notes (Signed)
Patient eating chips in lobby.

## 2017-02-13 NOTE — ED Notes (Signed)
Pt wants to complain about wait time.  Apologized for delay, but informed pt we see people by complaint, not by time of arrival.  Pt stated "I don't see why people with heart pain are more important than me.  I have pain too and I should be seen right away."  Explained to pt I was sorry she was having pain, but any life threatening condition would come first.  Assured her we were looking at her labs to make sure she was ok and the doctor would order any further testing if needed.  Pt wanted to go to xray.  Asked why she needed an xray and she stated "because other people are getting xrays and I need one too."  Asked her what she needed xrayed and pt states she figured something needed checking.  Pt then asked if I was a Engineer, civil (consulting)nurse.  Told her my name and that I was the charge nurse today.  Pt stated "well I don't know why they sent you out here because I don't need no damn nurse."  Pt given number to pt experience.

## 2017-02-13 NOTE — ED Triage Notes (Signed)
Pt reports increased frequency of periods. Pt rpeorts vaginal bleeding intermittently for last three weeks. Pt reports abd cramping and reports had not been able to see pcp and reports made an appointment with Dr. Emelda FearFerguson this am. Pt reports pain remains intense.

## 2017-02-13 NOTE — Telephone Encounter (Signed)
Patient is currently in ER for eval of irregular menses with pain.   Reports that she would like megestrol for prolonged cycle.   MD please advise.

## 2017-02-14 MED ORDER — IBUPROFEN 800 MG PO TABS: 800.0000 mg | ORAL_TABLET | Freq: Three times a day (TID) | ORAL | 0 refills | Status: DC | PRN

## 2017-02-14 NOTE — Telephone Encounter (Signed)
Same instructions, double the ocp , give motrin 800mg  for cramps every 8 hours

## 2017-02-14 NOTE — Telephone Encounter (Signed)
Patient returned call and made aware.

## 2017-02-14 NOTE — Telephone Encounter (Signed)
Call placed to patient and patient made aware.   States [facility] and [facility] another type. Reports that she is having severe cramping and again requested something to make her cycle stop.   States that she can't get into GYN before 03/03/2017 appointment.

## 2017-02-14 NOTE — Telephone Encounter (Signed)
Call placed to patient. No answer. No VM.  

## 2017-02-15 ENCOUNTER — Emergency Department (HOSPITAL_COMMUNITY)
Admission: EM | Admit: 2017-02-15 | Discharge: 2017-02-15 | Disposition: A | Discharge status: Home / Self Care | Payer: BLUE CROSS/BLUE SHIELD | Attending: Emergency Medicine | Admitting: Emergency Medicine

## 2017-02-15 ENCOUNTER — Encounter (HOSPITAL_COMMUNITY): Payer: Self-pay | Admitting: *Deleted

## 2017-02-15 DIAGNOSIS — Z79899 Other long term (current) drug therapy: Secondary | ICD-10-CM | POA: Diagnosis not present

## 2017-02-15 DIAGNOSIS — N938 Other specified abnormal uterine and vaginal bleeding: Secondary | ICD-10-CM | POA: Insufficient documentation

## 2017-02-15 DIAGNOSIS — N939 Abnormal uterine and vaginal bleeding, unspecified: Secondary | ICD-10-CM | POA: Diagnosis present

## 2017-02-15 LAB — CBC WITH DIFFERENTIAL/PLATELET
BASOS ABS: 0 10*3/uL (ref 0.0–0.1)
Basophils Relative: 1 %
Eosinophils Absolute: 0.1 10*3/uL (ref 0.0–0.7)
Eosinophils Relative: 1 %
HCT: 33.3 % — ABNORMAL LOW (ref 36.0–46.0)
Hemoglobin: 11.3 g/dL — ABNORMAL LOW (ref 12.0–15.0)
LYMPHS PCT: 27 %
Lymphs Abs: 2.3 10*3/uL (ref 0.7–4.0)
MCH: 29.2 pg (ref 26.0–34.0)
MCHC: 33.9 g/dL (ref 30.0–36.0)
MCV: 86 fL (ref 78.0–100.0)
MONO ABS: 0.6 10*3/uL (ref 0.1–1.0)
Monocytes Relative: 7 %
NEUTROS ABS: 5.7 10*3/uL (ref 1.7–7.7)
Neutrophils Relative %: 64 %
PLATELETS: 290 10*3/uL (ref 150–400)
RBC: 3.87 MIL/uL (ref 3.87–5.11)
RDW: 12.4 % (ref 11.5–15.5)
WBC: 8.8 10*3/uL (ref 4.0–10.5)

## 2017-02-15 LAB — I-STAT BETA HCG BLOOD, ED (MC, WL, AP ONLY): I-stat hCG, quantitative: <5 m[IU]/mL (ref <5)

## 2017-02-15 MED ORDER — NAPROXEN 500 MG PO TABS: ORAL_TABLET | ORAL | 0 refills | Status: DC

## 2017-02-15 MED ORDER — KETOROLAC TROMETHAMINE 60 MG/2ML IM SOLN
60.0000 mg | Freq: Once | INTRAMUSCULAR | Status: AC
  Administered 2017-02-15: 60 mg via INTRAMUSCULAR
  Filled 2017-02-15: qty 2

## 2017-02-15 NOTE — Discharge Instructions (Signed)
Increase your birth control pills to 1 pill 3 times a day for 7 days. [facility]. Take the naproxen twice a day with acetaminophen 650 mg 4 times a day for pain. Recheck if you get dizzy, weak, light headed, chest pain, shortness of breath or pain or swelling in your legs.

## 2017-02-15 NOTE — ED Triage Notes (Signed)
Pt c/o excessive vaginal bleeding x 1 week; pt states she started a new kind of birth control pill recently and she feels different; pt c/o abdominal pain; pt states she is soaking about 4-5 pads per hour

## 2017-02-15 NOTE — ED Provider Notes (Signed)
[facility] DEPT Provider Note   CSN: [id number] Arrival date & time: 02/15/17  0129  Time seen 02:17 AM   History   Chief Complaint Chief Complaint  Patient presents with  . Vaginal Bleeding    HPI Shelley Vargas is a 41 y.o. female.  HPI   patient initially told me she just started having abnormal vaginal bleeding on March 14. However during the course of conversation she actually has had a change in her menstrual periods for the past 6 months. She reports heavier bleeding and passing blood clots. She states she has not been on birth control pills for about 10 years however she was on Depo-Provera but she stopped that 1-2 years ago. She states her last normal period was February 27. She states her primary care doctor started her on birth control pills with that menses because of the blood clot she had been passing. She states she started bleeding again on March 14 and has been bleeding since. She states that is the first time she bled early and has had continued bleeding. She denies any abdominal pain but she has pain down in her inguinal/groin area. She states it's sharp and tonight it got worse. She states she's using 3 pads an hour. She was seen in the ED 2 days ago, March 19 and was prescribed a different birth control pill. She took it on March 20 for the first time. She took 2 pills. She has taken no meds for her menstrual cramping. She states she has an appointment on April 6 with Dr. Emelda FearFerguson.  PCP Milinda AntisURHAM, KAWANTA, MD   Past Medical History:  Diagnosis Date  . Bronchitis   . Bronchitis   . Eczema   . Iron deficiency anemia     Patient Active Problem List   Diagnosis Date Noted  . Routine general medical examination at a health care facility 06/13/2014  . Keloid 01/07/2014  . Intertrigo 01/07/2014  . Eczema 05/28/2013  . Hand dermatitis 08/28/2012  . Tobacco user 07/11/2011  . Overweight(278.02) 07/11/2011  . Iron deficiency anemia 07/11/2011    Past Surgical  History:  Procedure Laterality Date  . rods in left leg      OB History    Gravida Para Term Preterm AB Living   5 3 3   2      SAB TAB Ectopic Multiple Live Births   1 1             Home Medications    Prior to Admission medications   Medication Sig Start Date End Date Taking? Authorizing Provider  Clobetasol Prop Emollient Base (CLOBETASOL PROPIONATE E) 0.05 % emollient cream Apply to affected areas twice a day as needed. Do not apply to face 01/18/17   Salley ScarletKawanta F [place], MD  clotrimazole (LOTRIMIN) 1 % cream Apply 1 application topically 2 (two) times daily. 01/18/17   Salley ScarletKawanta F [place], MD  diphenhydrAMINE (BENADRYL) 25 MG tablet Take 25 mg by mouth every 6 (six) hours as needed.    Historical Provider, MD  fluconazole (DIFLUCAN) 150 MG tablet Take 1 and repeat in 3 days 01/18/17   Salley ScarletKawanta F [place], MD  ibuprofen (ADVIL,MOTRIN) 800 MG tablet Take 1 tablet (800 mg total) by mouth every 8 (eight) hours as needed. 02/14/17   Salley ScarletKawanta F [place], MD  naproxen (NAPROSYN) 500 MG tablet Take 1 po BID with food prn pain 02/15/17   Devoria AlbeIva Donjuan [place], MD  Norethindrone Acetate-Ethinyl Estradiol (LOESTRIN 1.5/30, 21,) 1.5-30 MG-MCG tablet Take 1 tablet by  mouth daily. 01/18/17   Salley Scarlet, MD  norgestimate-ethinyl estradiol (SPRINTEC 28) 0.25-35 MG-MCG tablet Take 1 tablet by mouth daily. 02/13/17   Maia Plan, MD  norgestimate-ethinyl estradiol (SPRINTEC 28) 0.25-35 MG-MCG tablet Take 1 tablet by mouth daily. 02/13/17   Maia Plan, MD    Family History Family History  Problem Relation Age of Onset  . Hypertension Mother   . Hypertension Father     Social History Social History  Substance Use Topics  . Smoking status: Never Smoker  . Smokeless tobacco: Never Used  . Alcohol use 0.6 oz/week    1 Glasses of wine per week     Comment: occasioanl     Allergies   Penicillins   Review of Systems Review of Systems  All other systems reviewed and are negative.    Physical  Exam Updated Vital Signs BP (!) 147/91 (BP Location: Right Arm)   Pulse 72   Temp 97.7 F (36.5 C) (Oral)   Resp 18   Ht 5\' 6"  (1.676 m)   Wt 189 lb (85.7 kg)   LMP 01/30/2017   SpO2 98%   BMI 30.51 kg/m   Vital signs normal    Physical Exam  Constitutional: She is oriented to person, place, and time. She appears well-developed and well-nourished.  Non-toxic appearance. She does not appear ill. No distress.  HENT:  Head: Normocephalic and atraumatic.  Right Ear: External ear normal.  Left Ear: External ear normal.  Nose: Nose normal. No mucosal edema or rhinorrhea.  Mouth/Throat: Oropharynx is clear and moist and mucous membranes are normal. No dental abscesses or uvula swelling.  Eyes: Conjunctivae and EOM are normal. Pupils are equal, round, and reactive to light.  Neck: Normal range of motion and full passive range of motion without pain. Neck supple.  Cardiovascular: Normal rate, regular rhythm and normal heart sounds.  Exam reveals no gallop and no friction rub.   No murmur heard. Pulmonary/Chest: Effort normal and breath sounds normal. No respiratory distress. She has no wheezes. She has no rhonchi. She has no rales. She exhibits no tenderness and no crepitus.  Abdominal: Soft. Normal appearance and bowel sounds are normal. She exhibits no distension. There is no tenderness. There is no rebound and no guarding.  Musculoskeletal: Normal range of motion. She exhibits no edema or tenderness.  Moves all extremities well.   Neurological: She is alert and oriented to person, place, and time. She has normal strength. No cranial nerve deficit.  Skin: Skin is warm, dry and intact. No rash noted. No erythema. No pallor.  Psychiatric: She has a normal mood and affect. Her speech is normal and behavior is normal. Her mood appears not anxious.  Nursing note and vitals reviewed.  Pelvic deferred, was just done 2 nights ago   ED Treatments / Results  Labs (all labs ordered are  listed, but only abnormal results are displayed) Results for orders placed or performed during the hospital encounter of 02/15/17  CBC with Differential  Result Value Ref Range   WBC 8.8 4.0 - 10.5 K/uL   RBC 3.87 3.87 - 5.11 MIL/uL   Hemoglobin 11.3 (L) 12.0 - 15.0 g/dL   HCT 95.2 (L) 84.1 - 32.4 %   MCV 86.0 78.0 - 100.0 fL   MCH 29.2 26.0 - 34.0 pg   MCHC 33.9 30.0 - 36.0 g/dL   RDW 40.1 02.7 - 25.3 %   Platelets 290 150 - 400 K/uL   Neutrophils Relative %  64 %   Neutro Abs 5.7 1.7 - 7.7 K/uL   Lymphocytes Relative 27 %   Lymphs Abs 2.3 0.7 - 4.0 K/uL   Monocytes Relative 7 %   Monocytes Absolute 0.6 0.1 - 1.0 K/uL   Eosinophils Relative 1 %   Eosinophils Absolute 0.1 0.0 - 0.7 K/uL   Basophils Relative 1 %   Basophils Absolute 0.0 0.0 - 0.1 K/uL  I-Stat beta hCG blood, ED  Result Value Ref Range   I-stat hCG, quantitative <5.0 <5 mIU/mL   Comment 3           Laboratory interpretation all normal except mild anemia    EKG  EKG Interpretation None       Radiology No results found.  Procedures Procedures (including critical care time)  Medications Ordered in ED Medications  ketorolac (TORADOL) injection 60 mg (60 mg Intramuscular Given 02/15/17 0305)     Initial Impression / Assessment and Plan / ED Course  I have reviewed the triage vital signs and the nursing notes.  Pertinent labs & imaging results that were available during my care of the patient were reviewed by me and considered in my medical decision making (see chart for details).  Patient presents with dysfunctional uterine bleeding. She has only taken her increased dose of birth control pills less than 24 hours ago. She is here for complaints of pelvic pain. She has taken no medications. She was just seen 2 days ago and had a pelvic exam at that time that was unremarkable. She has a GYN referral on April 6. Patient was given Toradol 60 mg IM. CBC and i-STAT beta hCG was done.  Pt has a mild drop in  her Hb, certainly nothing requiring emergent intervention (blood transfusion or D&C tonight). Pt was advised to take her BCP's 3 times a day x 7 days, keep her appointment at [facility]. She should return to the ED for symptomatic anemia or signs of PE/DVT.  She was given naproxen for her pelvic pain that she can take with acetaminophen.   Final Clinical Impressions(s) / ED Diagnoses   Final diagnoses:  Dysfunctional uterine bleeding    New Prescriptions New Prescriptions   NAPROXEN (NAPROSYN) 500 MG TABLET    Take 1 po BID with food prn pain    Plan discharge  Devoria Albe, MD, Concha Pyo, MD 02/15/17 [phone]

## 2017-02-27 ENCOUNTER — Ambulatory Visit (INDEPENDENT_AMBULATORY_CARE_PROVIDER_SITE_OTHER): Payer: BLUE CROSS/BLUE SHIELD | Admitting: Obstetrics & Gynecology

## 2017-02-27 ENCOUNTER — Encounter: Payer: Self-pay | Admitting: Obstetrics & Gynecology

## 2017-02-27 VITALS — BP 124/76 | HR 74 | Wt 191.0 lb

## 2017-02-27 DIAGNOSIS — N938 Other specified abnormal uterine and vaginal bleeding: Secondary | ICD-10-CM | POA: Diagnosis not present

## 2017-02-27 MED ORDER — MEGESTROL ACETATE 40 MG PO TABS: ORAL_TABLET | ORAL | 3 refills | Status: DC

## 2017-02-27 NOTE — Progress Notes (Signed)
Chief Complaint  Patient presents with  . discuss ablation    Blood pressure 124/76, pulse 74, weight 191 lb (86.6 kg), last menstrual period 01/30/2017.  40 y.o. [id number] Patient's last menstrual period was 01/30/2017. The current method of family planning is tubal ligation.  Outpatient Encounter Prescriptions as of 02/27/2017  Medication Sig  . Norethindrone Acetate-Ethinyl Estradiol (LOESTRIN 1.5/30, 21,) 1.5-30 MG-MCG tablet Take 1 tablet by mouth daily.  . Clobetasol Prop Emollient Base (CLOBETASOL PROPIONATE E) 0.05 % emollient cream Apply to affected areas twice a day as needed. Do not apply to face (Patient not taking: Reported on 02/27/2017)  . clotrimazole (LOTRIMIN) 1 % cream Apply 1 application topically 2 (two) times daily. (Patient not taking: Reported on 02/27/2017)  . diphenhydrAMINE (BENADRYL) 25 MG tablet Take 25 mg by mouth every 6 (six) hours as needed.  . fluconazole (DIFLUCAN) 150 MG tablet Take 1 and repeat in 3 days (Patient not taking: Reported on 02/27/2017)  . ibuprofen (ADVIL,MOTRIN) 800 MG tablet Take 1 tablet (800 mg total) by mouth every 8 (eight) hours as needed. (Patient not taking: Reported on 02/27/2017)  . megestrol (MEGACE) 40 MG tablet 3 tablets a day for 5 days, 2 tablets a day for 5 days then 1 tablet daily  . naproxen (NAPROSYN) 500 MG tablet Take 1 po BID with food prn pain (Patient not taking: Reported on 02/27/2017)  . norgestimate-ethinyl estradiol (SPRINTEC 28) 0.25-35 MG-MCG tablet Take 1 tablet by mouth daily. (Patient not taking: Reported on 02/27/2017)  . [DISCONTINUED] norgestimate-ethinyl estradiol (SPRINTEC 28) 0.25-35 MG-MCG tablet Take 1 tablet by mouth daily.   No facility-administered encounter medications on file as of 02/27/2017.     Subjective patient initially told me she just started having abnormal vaginal bleeding on March 14. However during the course of conversation she actually has had a change in her menstrual periods for the past  6 months. She reports heavier bleeding and passing blood clots. She states she has not been on birth control pills for about 10 years however she was on Depo-Provera but she stopped that 1-2 years ago. She states her last normal period was February 27. She states her primary care doctor started her on birth control pills with that menses because of the blood clot she had been passing. She states she started bleeding again on March 14 and has been bleeding since. She states that is the first time she bled early and has had continued bleeding. She denies any abdominal pain but she has pain down in her inguinal/groin area. She states it's sharp and tonight it got worse. She states she's using 3 pads an hour. She was seen in the ED 2 days ago, March 19 and was prescribed a different birth control pill. She took it on March 20 for the first time. She took 2 pills. She has taken no meds for her menstrual cramping  Sonogram was normal  Objective   Pertinent ROS No burning with urination, frequency or urgency No nausea, vomiting or diarrhea Nor fever chills or other constitutional symptoms    Labs or studies    Normal diameter endometrial stripe with minimal avascular internal heterogeneity - query hemorrhage. Ovaries unremarkable.  Please see below for data measurements:   LMP: 02-08-17 Uterus: length 8.1 cm; width 6.3; height 4.9 cm Endometrium: 0.53 cm Right ovary: length 2.2 cm; transverse 1.8 cm; AP 1.5 cm Left ovary: length 2.7 cm; transverse 2.3 cm; AP 1.4 cm  Result Narrative  EXAM: [place] ENDOVAGINAL (NON-OB) DATE: 02/17/2017 12:31 PM ACCESSION: [id number] UN DICTATED: 02/17/2017 12:39 PM INTERPRETATION LOCATION: [facility]  CLINICAL INDICATION: 41 years old Female with AUB-N93.9-Abnormal uterine bleeding (AUB)   COMPARISON: None  TECHNIQUE:Ultrasound views of the pelvis were obtained endovaginally using gray scale and color Doppler imaging.  FINDINGS: No focal myometrial mass  was seen.The endometrium was normal in thickness with heterogeneous fluid within it. No abnormal endometrial flow on Doppler imaging. Small nabothian cysts. Parametrial vascular engorgement.  Small cystic areas were seen within both ovaries compatible with follicles.Appropriate flow was seen in both ovaries with color and spectral doppler.No abnormal pelvic fluid was seen.       Impression Diagnoses this Encounter::   ICD-9-CM ICD-10-CM   1. DUB (dysfunctional uterine bleeding) 626.8 N93.8    Normal sonogram    Established relevant diagnosis(es):   Plan/Recommendations: Meds ordered this encounter  Medications  . megestrol (MEGACE) 40 MG tablet    Sig: 3 tablets a day for 5 days, 2 tablets a day for 5 days then 1 tablet daily    Dispense:  45 tablet    Refill:  3    Labs or Scans Ordered: No orders of the defined types were placed in this encounter.   Management:: Will trial with megestrol 40 and assess her response at that time and decide on endometrial ablation or not  Follow up Return in about 2 weeks (around 03/13/2017) for Follow up, with Dr Despina Hidden.        Face to face time:  20 minutes  Greater than 50% of the visit time was spent in counseling and coordination of care with the patient.  The summary and outline of the counseling and care coordination is summarized in the note above.   All questions were answered.  Past Medical History:  Diagnosis Date  . Bronchitis   . Bronchitis   . Eczema   . Iron deficiency anemia     Past Surgical History:  Procedure Laterality Date  . rods in left leg    . TUBAL LIGATION  1997    OB History    Gravida Para Term Preterm AB Living   SAB TAB Ectopic Multiple Live Births   1 1            Allergies  Allergen Reactions  . Penicillins Other (See Comments)    Yeast infection Has patient had a PCN reaction causing immediate rash, facial/tongue/throat swelling, SOB or lightheadedness with  hypotension: No Has patient had a PCN reaction causing severe rash involving mucus membranes or skin necrosis: No Has patient had a PCN reaction that required hospitalization: no Has patient had a PCN reaction occurring within the last 10 years: Yes If all of the above answers are "NO", then may proceed with Cephalosporin use.     Social History   Social History  . Marital status: Married    Spouse name: N/A  . Number of children: N/A  . Years of education: N/A   Social History Main Topics  . Smoking status: Never Smoker  . Smokeless tobacco: Never Used  . Alcohol use 0.6 oz/week    1 Glasses of wine per week     Comment: occasioanl  . Drug use: No  . Sexual activity: Not Currently    Birth control/ protection: None   Other Topics Concern  . None   Social History Narrative  . None    Family History  Problem Relation Age of Onset  . Hypertension Mother   . Hypertension Father

## 2017-03-13 ENCOUNTER — Ambulatory Visit: Payer: BLUE CROSS/BLUE SHIELD | Admitting: Obstetrics & Gynecology

## 2017-03-17 ENCOUNTER — Encounter: Payer: Self-pay | Admitting: *Deleted

## 2017-03-17 ENCOUNTER — Ambulatory Visit: Payer: BLUE CROSS/BLUE SHIELD | Admitting: Obstetrics & Gynecology

## 2017-07-17 ENCOUNTER — Telehealth: Payer: Self-pay | Admitting: Family Medicine

## 2017-07-17 NOTE — Telephone Encounter (Signed)
Last PPD noted in 01/2015. Will need new PPD for work purposes.   Call placed to patient. No answer. No VM.

## 2017-07-17 NOTE — Telephone Encounter (Signed)
New Message  Pt voiced needing TB test faxed to her new job.  Fax# is [phone]

## 2017-07-18 NOTE — Telephone Encounter (Signed)
Call placed to patient and patient made aware.   Patient will come in for updated PPD.

## 2017-07-19 ENCOUNTER — Encounter: Payer: Self-pay | Admitting: Family Medicine

## 2017-07-19 ENCOUNTER — Ambulatory Visit (INDEPENDENT_AMBULATORY_CARE_PROVIDER_SITE_OTHER): Payer: BLUE CROSS/BLUE SHIELD | Admitting: Family Medicine

## 2017-07-19 DIAGNOSIS — Z111 Encounter for screening for respiratory tuberculosis: Secondary | ICD-10-CM

## 2017-07-19 NOTE — Progress Notes (Signed)
PPD TB skin test applied to left forearm without difficulty.  Pt instructed to not rub or scratch at area.  Needs to return Friday for reading of test.  Pt acknowledged understanding

## 2017-07-24 ENCOUNTER — Ambulatory Visit (INDEPENDENT_AMBULATORY_CARE_PROVIDER_SITE_OTHER): Payer: BLUE CROSS/BLUE SHIELD | Admitting: *Deleted

## 2017-07-24 DIAGNOSIS — Z111 Encounter for screening for respiratory tuberculosis: Secondary | ICD-10-CM

## 2017-07-24 NOTE — Progress Notes (Signed)
Patient seen in office for TB screening.   Patient had PPD placed on 07/19/2017, but did not return to have test read.   New PPD placed and advised patient must return in 48 hours. Verbalized understanding.

## 2017-07-26 ENCOUNTER — Ambulatory Visit: Payer: BLUE CROSS/BLUE SHIELD | Admitting: Family Medicine

## 2017-07-26 DIAGNOSIS — Z111 Encounter for screening for respiratory tuberculosis: Secondary | ICD-10-CM

## 2017-07-26 LAB — TB SKIN TEST
Induration: 0 mm
TB Skin Test: NEGATIVE

## 2017-08-17 ENCOUNTER — Encounter: Payer: Self-pay | Admitting: Family Medicine

## 2017-09-26 ENCOUNTER — Telehealth: Payer: Self-pay | Admitting: Obstetrics and Gynecology

## 2017-09-26 MED ORDER — MEGESTROL ACETATE 40 MG PO TABS: ORAL_TABLET | ORAL | 3 refills | Status: DC

## 2017-09-26 NOTE — Telephone Encounter (Signed)
Patient has been bleeding for 2-3 weeks and has now decided she wants to have an ablation. It was discussed at her last visit with Dr Despina HiddenEure and she would like to proceed. She has scheduled a pre-op appointment on 11/8 but would like a refill on the megestrol. Please advise.

## 2017-09-26 NOTE — Addendum Note (Signed)
Addended by: Cyril MourningGRIFFIN, Javonte Elenes A on: 09/26/2017 01:57 PM   Modules accepted: Orders

## 2017-09-26 NOTE — Telephone Encounter (Signed)
Will refill megace 

## 2017-10-02 ENCOUNTER — Encounter: Payer: Self-pay | Admitting: Family Medicine

## 2017-10-05 ENCOUNTER — Ambulatory Visit: Payer: BLUE CROSS/BLUE SHIELD | Admitting: Obstetrics & Gynecology

## 2017-10-20 ENCOUNTER — Other Ambulatory Visit: Payer: Self-pay | Admitting: Obstetrics & Gynecology

## 2017-11-14 ENCOUNTER — Other Ambulatory Visit: Payer: Self-pay

## 2017-11-14 ENCOUNTER — Ambulatory Visit (INDEPENDENT_AMBULATORY_CARE_PROVIDER_SITE_OTHER): Payer: BLUE CROSS/BLUE SHIELD | Admitting: Obstetrics & Gynecology

## 2017-11-14 ENCOUNTER — Encounter: Payer: Self-pay | Admitting: Obstetrics & Gynecology

## 2017-11-14 VITALS — BP 124/88 | HR 75 | Ht 64.0 in | Wt 200.0 lb

## 2017-11-14 DIAGNOSIS — R102 Pelvic and perineal pain: Secondary | ICD-10-CM

## 2017-11-14 DIAGNOSIS — N946 Dysmenorrhea, unspecified: Secondary | ICD-10-CM | POA: Diagnosis not present

## 2017-11-14 DIAGNOSIS — N921 Excessive and frequent menstruation with irregular cycle: Secondary | ICD-10-CM | POA: Diagnosis not present

## 2017-11-14 NOTE — Progress Notes (Signed)
Preoperative History and Physical  Shelley Vargas is a 41 y.o. [id number] with No LMP recorded. admitted for a .  Preoperative History and Physical  Shelley Vargas is a 41 y.o. [id number] with Patient's last menstrual period was 12/01/2017. admitted for a hysteroscopy uterine curettage endometrial ablation.  Pt has 10 month history of DUB responded to megestrol has normal sonogram Wants to proceed with ablation  patient initially told me she just started having abnormal vaginal bleeding on March 14. However during the course of conversation she actually has had a change in her menstrual periods for the past 6 months. She reports heavier bleeding and passing blood clots. She states she has not been on birth control pills for about 10 years however she was on Depo-Provera but she stopped that 1-2 years ago. She states her last normal period was February 27. She states her primary care doctor started her on birth control pills with that menses because of the blood clot she had been passing. She states she started bleeding again on March 14 and has been bleeding since. She states that is the first time she bled early and has had continued bleeding. She denies any abdominal pain but she has pain down in her inguinal/groin area. She states it's sharp and tonight it got worse. She states she's using 3 pads an hour. She was seen in the ED 2 days ago, March 19 and was prescribed a different birth control pill. She took it on March 20 for the first time. She took 2 pills. She has taken no meds for her menstrual cramping  Sonogram was normal     PMH:    Past Medical History:  Diagnosis Date  . Bronchitis   . Bronchitis   . Eczema   . Iron deficiency anemia     PSH:     Past Surgical History:  Procedure Laterality Date  . rods in left leg    . TUBAL LIGATION  1997    POb/GynH:      OB History    Gravida Para Term Preterm AB Living   5 3 3   2      SAB TAB Ectopic Multiple Live Births   1 1             SH:   Social History   Tobacco Use  . Smoking status: Never Smoker  . Smokeless tobacco: Never Used  Substance Use Topics  . Alcohol use: No    Alcohol/week: 0.6 oz    Types: 1 Glasses of wine per week    Frequency: Never  . Drug use: No    FH:    Family History  Problem Relation Age of Onset  . Hypertension Mother   . Hypertension Father      Allergies:  Allergies  Allergen Reactions  . Penicillins Other (See Comments)    Yeast infection Has patient had a PCN reaction causing immediate rash, facial/tongue/throat swelling, SOB or lightheadedness with hypotension: No Has patient had a PCN reaction causing severe rash involving mucus membranes or skin necrosis: No Has patient had a PCN reaction that required hospitalization: no Has patient had a PCN reaction occurring within the last 10 years: Yes If all of the above answers are "NO", then may proceed with Cephalosporin use.     Medications:       Current Outpatient Medications:  .  megestrol (MEGACE) 40 MG tablet, 3 tablets a day for 5 days, 2 tablets a day for 5 days then  1 tablet daily, Disp: 45 tablet, Rfl: 3 .  naproxen (NAPROSYN) 500 MG tablet, Take 1 po BID with food prn pain, Disp: 30 tablet, Rfl: 0 .  Clobetasol Prop Emollient Base (CLOBETASOL PROPIONATE E) 0.05 % emollient cream, Apply to affected areas twice a day as needed. Do not apply to face (Patient not taking: Reported on 02/27/2017), Disp: 60 g, Rfl: 2 .  clotrimazole (LOTRIMIN) 1 % cream, Apply 1 application topically 2 (two) times daily. (Patient not taking: Reported on 02/27/2017), Disp: 30 g, Rfl: 0 .  diphenhydrAMINE (BENADRYL) 25 MG tablet, Take 25 mg by mouth every 6 (six) hours as needed., Disp: , Rfl:  .  fluconazole (DIFLUCAN) 150 MG tablet, Take 1 and repeat in 3 days (Patient not taking: Reported on 02/27/2017), Disp: 2 tablet, Rfl: 0 .  ibuprofen (ADVIL,MOTRIN) 800 MG tablet, Take 1 tablet (800 mg total) by mouth every 8 (eight) hours as  needed. (Patient not taking: Reported on 02/27/2017), Disp: 30 tablet, Rfl: 0 .  megestrol (MEGACE) 40 MG tablet, TAKE 3 TABLETS BY MOUTH DAILY FOR 5 DAYS, TAKE 2 TABLETS DAILY FOR 5 DAYS, THEN TAKE 1 TABLET DAILY (Patient not taking: Reported on 11/14/2017), Disp: 45 tablet, Rfl: 11 .  Norethindrone Acetate-Ethinyl Estradiol (LOESTRIN 1.5/30, 21,) 1.5-30 MG-MCG tablet, Take 1 tablet by mouth daily. (Patient not taking: Reported on 11/14/2017), Disp: 1 Package, Rfl: 11 .  norgestimate-ethinyl estradiol (SPRINTEC 28) 0.25-35 MG-MCG tablet, Take 1 tablet by mouth daily. (Patient not taking: Reported on 02/27/2017), Disp: 1 Package, Rfl: 0  Review of Systems:   Review of Systems  Constitutional: Negative for fever, chills, weight loss, malaise/fatigue and diaphoresis.  HENT: Negative for hearing loss, ear pain, nosebleeds, congestion, sore throat, neck pain, tinnitus and ear discharge.   Eyes: Negative for blurred vision, double vision, photophobia, pain, discharge and redness.  Respiratory: Negative for cough, hemoptysis, sputum production, shortness of breath, wheezing and stridor.   Cardiovascular: Negative for chest pain, palpitations, orthopnea, claudication, leg swelling and PND.  Gastrointestinal: Positive for abdominal pain. Negative for heartburn, nausea, vomiting, diarrhea, constipation, blood in stool and melena.  Genitourinary: Negative for dysuria, urgency, frequency, hematuria and flank pain.  Musculoskeletal: Negative for myalgias, back pain, joint pain and falls.  Skin: Negative for itching and rash.  Neurological: Negative for dizziness, tingling, tremors, sensory change, speech change, focal weakness, seizures, loss of consciousness, weakness and headaches.  Endo/Heme/Allergies: Negative for environmental allergies and polydipsia. Does not bruise/bleed easily.  Psychiatric/Behavioral: Negative for depression, suicidal ideas, hallucinations, memory loss and substance abuse. The patient  is not nervous/anxious and does not have insomnia.      PHYSICAL EXAM:  Blood pressure 124/88, pulse 75, height 5\' 4"  (1.626 m), weight 200 lb (90.7 kg).    Vitals reviewed. Constitutional: She is oriented to person, place, and time. She appears well-developed and well-nourished.  HENT:  Head: Normocephalic and atraumatic.  Right Ear: External ear normal.  Left Ear: External ear normal.  Nose: Nose normal.  Mouth/Throat: Oropharynx is clear and moist.  Eyes: Conjunctivae and EOM are normal. Pupils are equal, round, and reactive to light. Right eye exhibits no discharge. Left eye exhibits no discharge. No scleral icterus.  Neck: Normal range of motion. Neck supple. No tracheal deviation present. No thyromegaly present.  Cardiovascular: Normal rate, regular rhythm, normal heart sounds and intact distal pulses.  Exam reveals no gallop and no friction rub.   No murmur heard. Respiratory: Effort normal and breath sounds normal. No respiratory distress.  She has no wheezes. She has no rales. She exhibits no tenderness.  GI: Soft. Bowel sounds are normal. She exhibits no distension and no mass. There is tenderness. There is no rebound and no guarding.  Genitourinary:       Vulva is normal without lesions Vagina is pink moist without discharge Cervix normal in appearance and pap is normal Uterus is normal size, contour, position, consistency, mobility, non-tender Adnexa is negative with normal sized ovaries by sonogram  Musculoskeletal: Normal range of motion. She exhibits no edema and no tenderness.  Neurological: She is alert and oriented to person, place, and time. She has normal reflexes. She displays normal reflexes. No cranial nerve deficit. She exhibits normal muscle tone. Coordination normal.  Skin: Skin is warm and dry. No rash noted. No erythema. No pallor.  Psychiatric: She has a normal mood and affect. Her behavior is normal. Judgment and thought content normal.    Labs: No  results found for this or any previous visit (from the past 336 hour(s)).  EKG: No orders found for this or any previous visit.  Imaging Studies: No results found.    Assessment: Menometrorrhagia dysmenorrhea  Plan: Hysteroscopy D&C endometrial ablation Lazaro ArmsLuther H Lamees Gable 11/14/2017 9:34 AM

## 2017-11-23 NOTE — Patient Instructions (Signed)
Shelley Vargas  11/23/2017     @PREFPERIOPPHARMACY @   Your procedure is scheduled on 12/06/2017   Report to [facility] at  745  A.M.  Call this number if you have problems the morning of surgery:  [phone]   Remember:  Do not eat food or drink liquids after midnight.  Take these medicines the morning of surgery with A SIP OF WATER None   Do not wear jewelry, make-up or nail polish.  Do not wear lotions, powders, or perfumes, or deodorant.  Do not shave 48 hours prior to surgery.  Men may shave face and neck.  Do not bring valuables to the hospital.  [facility] is not responsible for any belongings or valuables.  Contacts, dentures or bridgework may not be worn into surgery.  Leave your suitcase in the car.  After surgery it may be brought to your room.  For patients admitted to the hospital, discharge time will be determined by your treatment team.  Patients discharged the day of surgery will not be allowed to drive home.   Name and phone number of your driver:   family Special instructions:  None  Please read over the following fact sheets that you were given. Anesthesia Post-op Instructions and Care and Recovery After Surgery       Dilation and Curettage or Vacuum Curettage Dilation and curettage (D&C) and vacuum curettage are minor procedures. A D&C involves stretching (dilation) the cervix and scraping (curettage) the inside lining of the uterus (endometrium). During a D&C, tissue is gently scraped from the endometrium, starting from the top portion of the uterus down to the lowest part of the uterus (cervix). During a vacuum curettage, the lining and tissue in the uterus are removed with the use of gentle suction. Curettage may be performed to either diagnose or treat a problem. As a diagnostic procedure, curettage is performed to examine tissues from the uterus. A diagnostic curettage may be done if you have:  Irregular bleeding in the  uterus.  Bleeding with the development of clots.  Spotting between menstrual periods.  Prolonged menstrual periods or other abnormal bleeding.  Bleeding after menopause.  No menstrual period (amenorrhea).  A change in size and shape of the uterus.  Abnormal endometrial cells discovered during a Pap test.  As a treatment procedure, curettage may be performed for the following reasons:  Removal of an IUD (intrauterine device).  Removal of retained placenta after giving birth.  Abortion.  Miscarriage.  Removal of endometrial polyps.  Removal of uncommon types of noncancerous lumps (fibroids).  Tell a health care provider about:  Any allergies you have, including allergies to prescribed medicine or latex.  All medicines you are taking, including vitamins, herbs, eye drops, creams, and over-the-counter medicines. This is especially important if you take any blood-thinning medicine. Bring a list of all of your medicines to your appointment.  Any problems you or family members have had with anesthetic medicines.  Any blood disorders you have.  Any surgeries you have had.  Your medical history and any medical conditions you have.  Whether you are pregnant or may be pregnant.  Recent vaginal infections you have had.  Recent menstrual periods, bleeding problems you have had, and what form of birth control (contraception) you use. What are the risks? Generally, this is a safe procedure. However, problems may occur, including:  Infection.  Heavy vaginal bleeding.  Allergic  reactions to medicines.  Damage to the cervix or other structures or organs.  Development of scar tissue (adhesions) inside the uterus, which can cause abnormal amounts of menstrual bleeding. This may make it harder to get pregnant in the future.  A hole (perforation) or puncture in the uterine wall. This is rare.  What happens before the procedure? Staying hydrated Follow instructions from  your health care provider about hydration, which may include:  Up to 2 hours before the procedure - you may continue to drink clear liquids, such as water, clear fruit juice, black coffee, and plain tea.  Eating and drinking restrictions Follow instructions from your health care provider about eating and drinking, which may include:  8 hours before the procedure - stop eating heavy meals or foods such as meat, fried foods, or fatty foods.  6 hours before the procedure - stop eating light meals or foods, such as toast or cereal.  6 hours before the procedure - stop drinking milk or drinks that contain milk.  2 hours before the procedure - stop drinking clear liquids. If your health care provider told you to take your medicine(s) on the day of your procedure, take them with only a sip of water.  Medicines  Ask your health care provider about: ? Changing or stopping your regular medicines. This is especially important if you are taking diabetes medicines or blood thinners. ? Taking medicines such as aspirin and ibuprofen. These medicines can thin your blood. Do not take these medicines before your procedure if your health care provider instructs you not to.  You may be given antibiotic medicine to help prevent infection. General instructions  For 24 hours before your procedure, do not: ? Douche. ? Use tampons. ? Use medicines, creams, or suppositories in the vagina. ? Have sexual intercourse.  You may be given a pregnancy test on the day of the procedure.  Plan to have someone take you home from the hospital or clinic.  You may have a blood or urine sample taken.  If you will be going home right after the procedure, plan to have someone with you for 24 hours. What happens during the procedure?  To reduce your risk of infection: ? Your health care team will wash or sanitize their hands. ? Your skin will be washed with soap.  An IV tube will be inserted into one of your  veins.  You will be given one of the following: ? A medicine that numbs the area in and around the cervix (local anesthetic). ? A medicine to make you fall asleep (general anesthetic).  You will lie down on your back, with your feet in foot rests (stirrups).  The size and position of your uterus will be checked.  A lubricated instrument (speculum or Sims retractor) will be inserted into the back side of your vagina. The speculum will be used to hold apart the walls of your vagina so your health care provider can see your cervix.  A tool (tenaculum) will be attached to the lip of the cervix to stabilize it.  Your cervix will be softened and dilated. This may be done by: ? Taking a medicine. ? Having tapered dilators or thin rods (laminaria) or gradual widening instruments (tapered dilators) inserted into your cervix.  A small, sharp, curved instrument (curette) will be used to scrape a small amount of tissue or cells from the endometrium or cervical canal. In some cases, gentle suction is applied with the curette. The curette will  then be removed. The cells will be taken to a lab for testing. The procedure may vary among health care providers and hospitals. What happens after the procedure?  You may have mild cramping, backache, pain, and light bleeding or spotting. You may pass small blood clots from your vagina.  You may have to wear compression stockings. These stockings help to prevent blood clots and reduce swelling in your legs.  Your blood pressure, heart rate, breathing rate, and blood oxygen level will be monitored until the medicines you were given have worn off. Summary  Dilation and curettage (D&C) involves stretching (dilation) the cervix and scraping (curettage) the inside lining of the uterus (endometrium).  After the procedure, you may have mild cramping, backache, pain, and light bleeding or spotting. You may pass small blood clots from your vagina.  Plan to have  someone take you home from the hospital or clinic. This information is not intended to replace advice given to you by your health care provider. Make sure you discuss any questions you have with your health care provider. Document Released: 11/14/2005 Document Revised: 07/31/2016 Document Reviewed: 07/31/2016 Elsevier Interactive Patient Education  2018 [employer].  Dilation and Curettage or Vacuum Curettage, Care After These instructions give you information about caring for yourself after your procedure. Your doctor may also give you more specific instructions. Call your doctor if you have any problems or questions after your procedure. Follow these instructions at home: Activity  Do not drive or use heavy machinery while taking prescription pain medicine.  For 24 hours after your procedure, avoid driving.  Take short walks often, followed by rest periods. Ask your doctor what activities are safe for you. After one or two days, you may be able to return to your normal activities.  Do not lift anything that is heavier than 10 lb (4.5 kg) until your doctor approves.  For at least 2 weeks, or as long as told by your doctor: ? Do not douche. ? Do not use tampons. ? Do not have sex. General instructions  Take over-the-counter and prescription medicines only as told by your doctor. This is very important if you take blood thinning medicine.  Do not take baths, swim, or use a hot tub until your doctor approves. Take showers instead of baths.  Wear compression stockings as told by your doctor.  It is up to you to get the results of your procedure. Ask your doctor when your results will be ready.  Keep all follow-up visits as told by your doctor. This is important. Contact a doctor if:  You have very bad cramps that get worse or do not get better with medicine.  You have very bad pain in your belly (abdomen).  You cannot drink fluids without throwing up (vomiting).  You get pain  in a different part of the area between your belly and thighs (pelvis).  You have bad-smelling discharge from your vagina.  You have a rash. Get help right away if:  You are bleeding a lot from your vagina. A lot of bleeding means soaking more than one sanitary pad in an hour, for 2 hours in a row.  You have clumps of blood (blood clots) coming from your vagina.  You have a fever or chills.  Your belly feels very tender or hard.  You have chest pain.  You have trouble breathing.  You cough up blood.  You feel dizzy.  You feel light-headed.  You pass out (faint).  You have  pain in your neck or shoulder area. Summary  Take short walks often, followed by rest periods. Ask your doctor what activities are safe for you. After one or two days, you may be able to return to your normal activities.  Do not lift anything that is heavier than 10 lb (4.5 kg) until your doctor approves.  Do not take baths, swim, or use a hot tub until your doctor approves. Take showers instead of baths.  Contact your doctor if you have any symptoms of infection, like bad-smelling discharge from your vagina. This information is not intended to replace advice given to you by your health care provider. Make sure you discuss any questions you have with your health care provider. Document Released: 08/23/2008 Document Revised: 08/01/2016 Document Reviewed: 08/01/2016 Elsevier Interactive Patient Education  2017 [place]. Hysteroscopy Hysteroscopy is a procedure used for looking inside the womb (uterus). It may be done for various reasons, including:  To evaluate abnormal bleeding, fibroid (benign, noncancerous) tumors, polyps, scar tissue (adhesions), and possibly cancer of the uterus.  To look for lumps (tumors) and other uterine growths.  To look for causes of why a woman cannot get pregnant (infertility), causes of recurrent loss of pregnancy (miscarriages), or a lost intrauterine device  (IUD).  To perform a sterilization by blocking the fallopian tubes from inside the uterus.  In this procedure, a thin, flexible tube with a tiny light and camera on the end of it (hysteroscope) is used to look inside the uterus. A hysteroscopy should be done right after a menstrual period to be sure you are not pregnant. LET [facility] CARE PROVIDER KNOW ABOUT:  Any allergies you have.  All medicines you are taking, including vitamins, herbs, eye drops, creams, and over-the-counter medicines.  Previous problems you or members of your family have had with the use of anesthetics.  Any blood disorders you have.  Previous surgeries you have had.  Medical conditions you have. RISKS AND COMPLICATIONS Generally, this is a safe procedure. However, as with any procedure, complications can occur. Possible complications include:  Putting a hole in the uterus.  Excessive bleeding.  Infection.  Damage to the cervix.  Injury to other organs.  Allergic reaction to medicines.  Too much fluid used in the uterus for the procedure.  BEFORE THE PROCEDURE  Ask your health care provider about changing or stopping any regular medicines.  Do not take aspirin or blood thinners for 1 week before the procedure, or as directed by your health care provider. These can cause bleeding.  If you smoke, do not smoke for 2 weeks before the procedure.  In some cases, a medicine is placed in the cervix the day before the procedure. This medicine makes the cervix have a larger opening (dilate). This makes it easier for the instrument to be inserted into the uterus during the procedure.  Do not eat or drink anything for at least 8 hours before the surgery.  Arrange for someone to take you home after the procedure. PROCEDURE  You may be given a medicine to relax you (sedative). You may also be given one of the following: ? A medicine that numbs the area around the cervix (local anesthetic). ? A medicine  that makes you sleep through the procedure (general anesthetic).  The hysteroscope is inserted through the vagina into the uterus. The camera on the hysteroscope sends a picture to a TV screen. This gives the surgeon a good view inside the uterus.  During the procedure,  air or a liquid is put into the uterus, which allows the surgeon to see better.  Sometimes, tissue is gently scraped from inside the uterus. These tissue samples are sent to a lab for testing. What to expect after the procedure  If you had a general anesthetic, you may be groggy for a couple hours after the procedure.  If you had a local anesthetic, you will be able to go home as soon as you are stable and feel ready.  You may have some cramping. This normally lasts for a couple days.  You may have bleeding, which varies from light spotting for a few days to menstrual-like bleeding for 3-7 days. This is normal.  If your test results are not back during the visit, make an appointment with your health care provider to find out the results. This information is not intended to replace advice given to you by your health care provider. Make sure you discuss any questions you have with your health care provider. Document Released: 02/20/2001 Document Revised: 04/21/2016 Document Reviewed: 06/13/2013 Elsevier Interactive Patient Education  2017 [place]. Hysteroscopy, Care After Refer to this sheet in the next few weeks. These instructions provide you with information on caring for yourself after your procedure. Your health care provider may also give you more specific instructions. Your treatment has been planned according to current medical practices, but problems sometimes occur. Call your health care provider if you have any problems or questions after your procedure. What can I expect after the procedure? After your procedure, it is typical to have the following:  You may have some cramping. This normally lasts for a  couple days.  You may have bleeding. This can vary from light spotting for a few days to menstrual-like bleeding for 3-7 days.  Follow these instructions at home:  Rest for the first 1-2 days after the procedure.  Only take over-the-counter or prescription medicines as directed by your health care provider. Do not take aspirin. It can increase the chances of bleeding.  Take showers instead of baths for 2 weeks or as directed by your health care provider.  Do not drive for 24 hours or as directed.  Do not drink alcohol while taking pain medicine.  Do not use tampons, douche, or have sexual intercourse for 2 weeks or until your health care provider says it is okay.  Take your temperature twice a day for 4-5 days. Write it down each time.  Follow your health care provider's advice about diet, exercise, and lifting.  If you develop constipation, you may: ? Take a mild laxative if your health care provider approves. ? Add bran foods to your diet. ? Drink enough fluids to keep your urine clear or pale yellow.  Try to have someone with you or available to you for the first 24-48 hours, especially if you were given a general anesthetic.  Follow up with your health care provider as directed. Contact a health care provider if:  You feel dizzy or lightheaded.  You feel sick to your stomach (nauseous).  You have abnormal vaginal discharge.  You have a rash.  You have pain that is not controlled with medicine. Get help right away if:  You have bleeding that is heavier than a normal menstrual period.  You have a fever.  You have increasing cramps or pain, not controlled with medicine.  You have new belly (abdominal) pain.  You pass out.  You have pain in the tops of your shoulders (shoulder  strap areas).  You have shortness of breath. This information is not intended to replace advice given to you by your health care provider. Make sure you discuss any questions you have  with your health care provider. Document Released: 09/04/2013 Document Revised: 04/21/2016 Document Reviewed: 06/13/2013 Elsevier Interactive Patient Education  2017 [place].  Endometrial Ablation Endometrial ablation is a procedure that destroys the thin inner layer of the lining of the uterus (endometrium). This procedure may be done:  To stop heavy periods.  To stop bleeding that is causing anemia.  To control irregular bleeding.  To treat bleeding caused by small tumors (fibroids) in the endometrium.  This procedure is often an alternative to major surgery, such as removal of the uterus and cervix (hysterectomy). As a result of this procedure:  You may not be able to have children. However, if you are premenopausal (you have not gone through menopause): ? You may still have a small chance of getting pregnant. ? You will need to use a reliable method of birth control after the procedure to prevent pregnancy.  You may stop having a menstrual period, or you may have only a small amount of bleeding during your period. Menstruation may return several years after the procedure.  Tell a health care provider about:  Any allergies you have.  All medicines you are taking, including vitamins, herbs, eye drops, creams, and over-the-counter medicines.  Any problems you or family members have had with the use of anesthetic medicines.  Any blood disorders you have.  Any surgeries you have had.  Any medical conditions you have. What are the risks? Generally, this is a safe procedure. However, problems may occur, including:  A hole (perforation) in the uterus or bowel.  Infection of the uterus, bladder, or vagina.  Bleeding.  Damage to other structures or organs.  An air bubble in the lung (air embolus).  Problems with pregnancy after the procedure.  Failure of the procedure.  Decreased ability to diagnose cancer in the endometrium.  What happens before the  procedure?  You will have tests of your endometrium to make sure there are no pre-cancerous cells or cancer cells present.  You may have an ultrasound of the uterus.  You may be given medicines to thin the endometrium.  Ask your health care provider about: ? Changing or stopping your regular medicines. This is especially important if you take diabetes medicines or blood thinners. ? Taking medicines such as aspirin and ibuprofen. These medicines can thin your blood. Do not take these medicines before your procedure if your doctor tells you not to.  Plan to have someone take you home from the hospital or clinic. What happens during the procedure?  You will lie on an exam table with your feet and legs supported as in a pelvic exam.  To lower your risk of infection: ? Your health care team will wash or sanitize their hands and put on germ-free (sterile) gloves. ? Your genital area will be washed with soap.  An IV tube will be inserted into one of your veins.  You will be given a medicine to help you relax (sedative).  A surgical instrument with a light and camera (resectoscope) will be inserted into your vagina and moved into your uterus. This allows your surgeon to see inside your uterus.  Endometrial tissue will be removed using one of the following methods: ? Radiofrequency. This method uses a radiofrequency-alternating electric current to remove the endometrium. ? Cryotherapy. This method uses  extreme cold to freeze the endometrium. ? Heated-free liquid. This method uses a heated saltwater (saline) solution to remove the endometrium. ? Microwave. This method uses high-energy microwaves to heat up the endometrium and remove it. ? Thermal balloon. This method involves inserting a catheter with a balloon tip into the uterus. The balloon tip is filled with heated fluid to remove the endometrium. The procedure may vary among health care providers and hospitals. What happens after the  procedure?  Your blood pressure, heart rate, breathing rate, and blood oxygen level will be monitored until the medicines you were given have worn off.  As tissue healing occurs, you may notice vaginal bleeding for 4-6 weeks after the procedure. You may also experience: ? Cramps. ? Thin, watery vaginal discharge that is light pink or brown in color. ? A need to urinate more frequently than usual. ? Nausea.  Do not drive for 24 hours if you were given a sedative.  Do not have sex or insert anything into your vagina until your health care provider approves. Summary  Endometrial ablation is done to treat the many causes of heavy menstrual bleeding.  The procedure may be done only after medications have been tried to control the bleeding.  Plan to have someone take you home from the hospital or clinic. This information is not intended to replace advice given to you by your health care provider. Make sure you discuss any questions you have with your health care provider. Document Released: 09/23/2004 Document Revised: 12/01/2016 Document Reviewed: 12/01/2016 Elsevier Interactive Patient Education  2017 [place] Anesthesia, Adult General anesthesia is the use of medicines to make a person "go to sleep" (be unconscious) for a medical procedure. General anesthesia is often recommended when a procedure:  Is long.  Requires you to be still or in an unusual position.  Is major and can cause you to lose blood.  Is impossible to do without general anesthesia.  The medicines used for general anesthesia are called general anesthetics. In addition to making you sleep, the medicines:  Prevent pain.  Control your blood pressure.  Relax your muscles.  Tell a health care provider about:  Any allergies you have.  All medicines you are taking, including vitamins, herbs, eye drops, creams, and over-the-counter medicines.  Any problems you or family members have had with  anesthetic medicines.  Types of anesthetics you have had in the past.  Any bleeding disorders you have.  Any surgeries you have had.  Any medical conditions you have.  Any history of heart or lung conditions, such as heart failure, sleep apnea, or chronic obstructive pulmonary disease (COPD).  Whether you are pregnant or may be pregnant.  Whether you use tobacco, alcohol, marijuana, or street drugs.  Any history of Armed forces logistics/support/administrative officer.  Any history of depression or anxiety. What are the risks? Generally, this is a safe procedure. However, problems may occur, including:  Allergic reaction to anesthetics.  Lung and heart problems.  Inhaling food or liquids from your stomach into your lungs (aspiration).  Injury to nerves.  Waking up during your procedure and being unable to move (rare).  Extreme agitation or a state of mental confusion (delirium) when you wake up from the anesthetic.  Air in the bloodstream, which can lead to stroke.  These problems are more likely to develop if you are having a major surgery or if you have an advanced medical condition. You can prevent some of these complications by answering all of your  health care provider's questions thoroughly and by following all pre-procedure instructions. General anesthesia can cause side effects, including:  Nausea or vomiting  A sore throat from the breathing tube.  Feeling cold or shivery.  Feeling tired, washed out, or achy.  Sleepiness or drowsiness.  Confusion or agitation.  What happens before the procedure? Staying hydrated Follow instructions from your health care provider about hydration, which may include:  Up to 2 hours before the procedure - you may continue to drink clear liquids, such as water, clear fruit juice, black coffee, and plain tea.  Eating and drinking restrictions Follow instructions from your health care provider about eating and drinking, which may include:  8 hours before the  procedure - stop eating heavy meals or foods such as meat, fried foods, or fatty foods.  6 hours before the procedure - stop eating light meals or foods, such as toast or cereal.  6 hours before the procedure - stop drinking milk or drinks that contain milk.  2 hours before the procedure - stop drinking clear liquids.  Medicines  Ask your health care provider about: ? Changing or stopping your regular medicines. This is especially important if you are taking diabetes medicines or blood thinners. ? Taking medicines such as aspirin and ibuprofen. These medicines can thin your blood. Do not take these medicines before your procedure if your health care provider instructs you not to. ? Taking new dietary supplements or medicines. Do not take these during the week before your procedure unless your health care provider approves them.  If you are told to take a medicine or to continue taking a medicine on the day of the procedure, take the medicine with sips of water. General instructions   Ask if you will be going home the same day, the following day, or after a longer hospital stay. ? Plan to have someone take you home. ? Plan to have someone stay with you for the first 24 hours after you leave the hospital or clinic.  For 3-6 weeks before the procedure, try not to use any tobacco products, such as cigarettes, chewing tobacco, and e-cigarettes.  You may brush your teeth on the morning of the procedure, but make sure to spit out the toothpaste. What happens during the procedure?  You will be given anesthetics through a mask and through an IV tube in one of your veins.  You may receive medicine to help you relax (sedative).  As soon as you are asleep, a breathing tube may be used to help you breathe.  An anesthesia specialist will stay with you throughout the procedure. He or she will help keep you comfortable and safe by continuing to give you medicines and adjusting the amount of  medicine that you get. He or she will also watch your blood pressure, pulse, and oxygen levels to make sure that the anesthetics do not cause any problems.  If a breathing tube was used to help you breathe, it will be removed before you wake up. The procedure may vary among health care providers and hospitals. What happens after the procedure?  You will wake up, often slowly, after the procedure is complete, usually in a recovery area.  Your blood pressure, heart rate, breathing rate, and blood oxygen level will be monitored until the medicines you were given have worn off.  You may be given medicine to help you calm down if you feel anxious or agitated.  If you will be going home the same day,  your health care provider may check to make sure you can stand, drink, and urinate.  Your health care providers will treat your pain and side effects before you go home.  Do not drive for 24 hours if you received a sedative.  You may: ? Feel nauseous and vomit. ? Have a sore throat. ? Have mental slowness. ? Feel cold or shivery. ? Feel sleepy. ? Feel tired. ? Feel sore or achy, even in parts of your body where you did not have surgery. This information is not intended to replace advice given to you by your health care provider. Make sure you discuss any questions you have with your health care provider. Document Released: 02/21/2008 Document Revised: 04/26/2016 Document Reviewed: 10/29/2015 Elsevier Interactive Patient Education  2018 [place] Anesthesia, Adult, Care After These instructions provide you with information about caring for yourself after your procedure. Your health care provider may also give you more specific instructions. Your treatment has been planned according to current medical practices, but problems sometimes occur. Call your health care provider if you have any problems or questions after your procedure. What can I expect after the procedure? After the  procedure, it is common to have:  Vomiting.  A sore throat.  Mental slowness.  It is common to feel:  Nauseous.  Cold or shivery.  Sleepy.  Tired.  Sore or achy, even in parts of your body where you did not have surgery.  Follow these instructions at home: For at least 24 hours after the procedure:  Do not: ? Participate in activities where you could fall or become injured. ? Drive. ? Use heavy machinery. ? Drink alcohol. ? Take sleeping pills or medicines that cause drowsiness. ? Make important decisions or sign legal documents. ? Take care of children on your own.  Rest. Eating and drinking  If you vomit, drink water, juice, or soup when you can drink without vomiting.  Drink enough fluid to keep your urine clear or pale yellow.  Make sure you have little or no nausea before eating solid foods.  Follow the diet recommended by your health care provider. General instructions  Have a responsible adult stay with you until you are awake and alert.  Return to your normal activities as told by your health care provider. Ask your health care provider what activities are safe for you.  Take over-the-counter and prescription medicines only as told by your health care provider.  If you smoke, do not smoke without supervision.  Keep all follow-up visits as told by your health care provider. This is important. Contact a health care provider if:  You continue to have nausea or vomiting at home, and medicines are not helpful.  You cannot drink fluids or start eating again.  You cannot urinate after 8-12 hours.  You develop a skin rash.  You have fever.  You have increasing redness at the site of your procedure. Get help right away if:  You have difficulty breathing.  You have chest pain.  You have unexpected bleeding.  You feel that you are having a life-threatening or urgent problem. This information is not intended to replace advice given to you by your  health care provider. Make sure you discuss any questions you have with your health care provider. Document Released: 02/20/2001 Document Revised: 04/18/2016 Document Reviewed: 10/29/2015 Elsevier Interactive Patient Education  [employer].

## 2017-11-26 ENCOUNTER — Other Ambulatory Visit: Payer: Self-pay | Admitting: Obstetrics & Gynecology

## 2017-11-29 ENCOUNTER — Encounter (HOSPITAL_COMMUNITY)
Admission: RE | Admit: 2017-11-29 | Discharge: 2017-11-29 | Disposition: A | Discharge status: Home / Self Care | Payer: BLUE CROSS/BLUE SHIELD | Source: Ambulatory Visit | Attending: Obstetrics & Gynecology | Admitting: Obstetrics & Gynecology

## 2017-12-01 ENCOUNTER — Encounter (HOSPITAL_COMMUNITY)
Admission: RE | Admit: 2017-12-01 | Discharge: 2017-12-01 | Disposition: A | Discharge status: Home / Self Care | Payer: BLUE CROSS/BLUE SHIELD | Source: Ambulatory Visit | Attending: Obstetrics & Gynecology | Admitting: Obstetrics & Gynecology

## 2017-12-01 ENCOUNTER — Encounter (HOSPITAL_COMMUNITY): Payer: Self-pay

## 2017-12-01 ENCOUNTER — Other Ambulatory Visit: Payer: Self-pay

## 2017-12-01 DIAGNOSIS — Z01812 Encounter for preprocedural laboratory examination: Secondary | ICD-10-CM | POA: Diagnosis not present

## 2017-12-01 HISTORY — DX: Excessive and frequent menstruation with irregular cycle: N92.1

## 2017-12-01 LAB — COMPREHENSIVE METABOLIC PANEL
ALT: 12 U/L — ABNORMAL LOW (ref 14–54)
ANION GAP: 7 (ref 5–15)
AST: 15 U/L (ref 15–41)
Albumin: 3.9 g/dL (ref 3.5–5.0)
Alkaline Phosphatase: 77 U/L (ref 38–126)
BILIRUBIN TOTAL: 0.4 mg/dL (ref 0.3–1.2)
BUN: 7 mg/dL (ref 6–20)
CO2: 22 mmol/L (ref 22–32)
Calcium: 8.7 mg/dL — ABNORMAL LOW (ref 8.9–10.3)
Chloride: 108 mmol/L (ref 101–111)
Creatinine, Ser: 0.73 mg/dL (ref 0.44–1.00)
GFR calc Af Amer: >60 mL/min (ref >60)
Glucose, Bld: 99 mg/dL (ref 65–99)
POTASSIUM: 3.2 mmol/L — AB (ref 3.5–5.1)
Sodium: 137 mmol/L (ref 135–145)
TOTAL PROTEIN: 7.7 g/dL (ref 6.5–8.1)

## 2017-12-01 LAB — CBC
HEMATOCRIT: 37.1 % (ref 36.0–46.0)
Hemoglobin: 11.8 g/dL — ABNORMAL LOW (ref 12.0–15.0)
MCH: 27.4 pg (ref 26.0–34.0)
MCHC: 31.8 g/dL (ref 30.0–36.0)
MCV: 86.1 fL (ref 78.0–100.0)
Platelets: 268 10*3/uL (ref 150–400)
RBC: 4.31 MIL/uL (ref 3.87–5.11)
RDW: 12.7 % (ref 11.5–15.5)
WBC: 5.8 10*3/uL (ref 4.0–10.5)

## 2017-12-01 LAB — URINALYSIS, ROUTINE W REFLEX MICROSCOPIC
Bilirubin Urine: NEGATIVE
Glucose, UA: NEGATIVE mg/dL
HGB URINE DIPSTICK: NEGATIVE
Ketones, ur: NEGATIVE mg/dL
Leukocytes, UA: NEGATIVE
Nitrite: NEGATIVE
PROTEIN: NEGATIVE mg/dL
Specific Gravity, Urine: 1.017 (ref 1.005–1.030)
pH: 6 (ref 5.0–8.0)

## 2017-12-01 LAB — RAPID HIV SCREEN (HIV 1/2 AB+AG)
HIV 1/2 ANTIBODIES: NONREACTIVE
HIV-1 P24 Antigen - HIV24: NONREACTIVE

## 2017-12-01 LAB — HCG, QUANTITATIVE, PREGNANCY

## 2017-12-06 ENCOUNTER — Ambulatory Visit (HOSPITAL_COMMUNITY)
Admission: RE | Admit: 2017-12-06 | Discharge: 2017-12-06 | Disposition: A | Discharge status: Home / Self Care | Payer: BLUE CROSS/BLUE SHIELD | Source: Ambulatory Visit | Attending: Obstetrics & Gynecology | Admitting: Obstetrics & Gynecology

## 2017-12-06 ENCOUNTER — Encounter (HOSPITAL_COMMUNITY)
Admission: RE | Disposition: A | Discharge status: Home / Self Care | Payer: Self-pay | Source: Ambulatory Visit | Attending: Obstetrics & Gynecology

## 2017-12-06 ENCOUNTER — Other Ambulatory Visit: Payer: Self-pay | Admitting: Obstetrics & Gynecology

## 2017-12-06 ENCOUNTER — Ambulatory Visit (HOSPITAL_COMMUNITY): Payer: BLUE CROSS/BLUE SHIELD | Admitting: Anesthesiology

## 2017-12-06 DIAGNOSIS — N921 Excessive and frequent menstruation with irregular cycle: Secondary | ICD-10-CM | POA: Insufficient documentation

## 2017-12-06 DIAGNOSIS — N946 Dysmenorrhea, unspecified: Secondary | ICD-10-CM | POA: Insufficient documentation

## 2017-12-06 DIAGNOSIS — N938 Other specified abnormal uterine and vaginal bleeding: Secondary | ICD-10-CM | POA: Insufficient documentation

## 2017-12-06 DIAGNOSIS — D509 Iron deficiency anemia, unspecified: Secondary | ICD-10-CM | POA: Diagnosis not present

## 2017-12-06 DIAGNOSIS — N840 Polyp of corpus uteri: Secondary | ICD-10-CM

## 2017-12-06 DIAGNOSIS — L309 Dermatitis, unspecified: Secondary | ICD-10-CM | POA: Diagnosis not present

## 2017-12-06 DIAGNOSIS — Z8249 Family history of ischemic heart disease and other diseases of the circulatory system: Secondary | ICD-10-CM | POA: Insufficient documentation

## 2017-12-06 DIAGNOSIS — Z880 Allergy status to penicillin: Secondary | ICD-10-CM | POA: Diagnosis not present

## 2017-12-06 HISTORY — PX: DILITATION & CURRETTAGE/HYSTROSCOPY WITH NOVASURE ABLATION: SHX5568

## 2017-12-06 SURGERY — DILATATION & CURETTAGE/HYSTEROSCOPY WITH NOVASURE ABLATION
Anesthesia: General

## 2017-12-06 MED ORDER — HYDROCODONE-ACETAMINOPHEN 5-325 MG PO TABS: 1.0000 | ORAL_TABLET | Freq: Four times a day (QID) | ORAL | 0 refills | Status: DC | PRN

## 2017-12-06 MED ORDER — ONDANSETRON HCL 4 MG/2ML IJ SOLN
INTRAMUSCULAR | Status: AC
  Filled 2017-12-06: qty 2

## 2017-12-06 MED ORDER — LIDOCAINE HCL (PF) 1 % IJ SOLN
INTRAMUSCULAR | Status: AC
  Filled 2017-12-06: qty 10

## 2017-12-06 MED ORDER — SODIUM CHLORIDE 0.9 % IR SOLN
Status: DC | PRN
  Administered 2017-12-06: 1000 mL

## 2017-12-06 MED ORDER — ROCURONIUM BROMIDE 50 MG/5ML IV SOLN
INTRAVENOUS | Status: AC
  Filled 2017-12-06: qty 2

## 2017-12-06 MED ORDER — PROPOFOL 10 MG/ML IV BOLUS
INTRAVENOUS | Status: DC | PRN
  Administered 2017-12-06: 30 mg via INTRAVENOUS
  Administered 2017-12-06: 170 mg via INTRAVENOUS

## 2017-12-06 MED ORDER — ONDANSETRON HCL 4 MG/2ML IJ SOLN
4.0000 mg | Freq: Once | INTRAMUSCULAR | Status: AC
  Administered 2017-12-06: 4 mg via INTRAVENOUS

## 2017-12-06 MED ORDER — DEXAMETHASONE SODIUM PHOSPHATE 4 MG/ML IJ SOLN
INTRAMUSCULAR | Status: AC
  Filled 2017-12-06: qty 1

## 2017-12-06 MED ORDER — FENTANYL CITRATE (PF) 100 MCG/2ML IJ SOLN
INTRAMUSCULAR | Status: DC | PRN
  Administered 2017-12-06: 50 ug via INTRAVENOUS
  Administered 2017-12-06: 100 ug via INTRAVENOUS

## 2017-12-06 MED ORDER — LIDOCAINE HCL (CARDIAC) 10 MG/ML IV SOLN
INTRAVENOUS | Status: DC | PRN
  Administered 2017-12-06: 50 mg via INTRAVENOUS

## 2017-12-06 MED ORDER — DEXAMETHASONE SODIUM PHOSPHATE 4 MG/ML IJ SOLN
4.0000 mg | Freq: Once | INTRAMUSCULAR | Status: AC
  Administered 2017-12-06: 4 mg via INTRAVENOUS

## 2017-12-06 MED ORDER — MIDAZOLAM HCL 2 MG/2ML IJ SOLN
1.0000 mg | INTRAMUSCULAR | Status: AC
  Administered 2017-12-06: 2 mg via INTRAVENOUS

## 2017-12-06 MED ORDER — FENTANYL CITRATE (PF) 100 MCG/2ML IJ SOLN
25.0000 ug | INTRAMUSCULAR | Status: DC | PRN
  Administered 2017-12-06: 50 ug via INTRAVENOUS
  Filled 2017-12-06: qty 2

## 2017-12-06 MED ORDER — MIDAZOLAM HCL 2 MG/2ML IJ SOLN
INTRAMUSCULAR | Status: AC
  Filled 2017-12-06: qty 2

## 2017-12-06 MED ORDER — KETOROLAC TROMETHAMINE 10 MG PO TABS: 10.0000 mg | ORAL_TABLET | Freq: Three times a day (TID) | ORAL | 0 refills | Status: DC | PRN

## 2017-12-06 MED ORDER — FENTANYL CITRATE (PF) 250 MCG/5ML IJ SOLN
INTRAMUSCULAR | Status: AC
Start: 2017-12-06 — End: 2017-12-06
  Filled 2017-12-06: qty 10

## 2017-12-06 MED ORDER — LACTATED RINGERS IV SOLN
INTRAVENOUS | Status: DC
  Administered 2017-12-06: 09:00:00 via INTRAVENOUS

## 2017-12-06 MED ORDER — CEFAZOLIN SODIUM-DEXTROSE 2-4 GM/100ML-% IV SOLN
2.0000 g | INTRAVENOUS | Status: AC
  Administered 2017-12-06: 2 g via INTRAVENOUS
  Filled 2017-12-06: qty 100

## 2017-12-06 MED ORDER — KETOROLAC TROMETHAMINE 30 MG/ML IJ SOLN
30.0000 mg | Freq: Once | INTRAMUSCULAR | Status: AC
  Administered 2017-12-06: 30 mg via INTRAVENOUS
  Filled 2017-12-06: qty 1

## 2017-12-06 MED ORDER — ONDANSETRON 8 MG PO TBDP: 8.0000 mg | ORAL_TABLET | Freq: Three times a day (TID) | ORAL | 0 refills | Status: DC | PRN

## 2017-12-06 MED ORDER — 0.9 % SODIUM CHLORIDE (POUR BTL) OPTIME
TOPICAL | Status: DC | PRN
  Administered 2017-12-06: 1000 mL

## 2017-12-06 SURGICAL SUPPLY — 29 items
BAG HAMPER (MISCELLANEOUS) ×2 IMPLANT
CLOTH BEACON ORANGE TIMEOUT ST (SAFETY) ×2 IMPLANT
COVER LIGHT HANDLE STERIS (MISCELLANEOUS) ×4 IMPLANT
ENDOMETRIAL ABLATION (INSTRUMENTS) IMPLANT
FORMALIN 10 PREFIL 120ML (MISCELLANEOUS) ×2 IMPLANT
GAUZE SPONGE 4X4 16PLY XRAY LF (GAUZE/BANDAGES/DRESSINGS) ×2 IMPLANT
GLOVE BIOGEL PI IND STRL 7.0 (GLOVE) ×2 IMPLANT
GLOVE BIOGEL PI IND STRL 8 (GLOVE) ×1 IMPLANT
GLOVE BIOGEL PI INDICATOR 7.0 (GLOVE) ×2
GLOVE BIOGEL PI INDICATOR 8 (GLOVE) ×1
GLOVE ECLIPSE 6.5 STRL STRAW (GLOVE) ×2 IMPLANT
GLOVE ECLIPSE 8.0 STRL XLNG CF (GLOVE) ×2 IMPLANT
GOWN STRL REUS W/TWL LRG LVL3 (GOWN DISPOSABLE) ×2 IMPLANT
GOWN STRL REUS W/TWL XL LVL3 (GOWN DISPOSABLE) ×2 IMPLANT
INST SET HYSTEROSCOPY (KITS) ×2 IMPLANT
IV NS 1000ML (IV SOLUTION) ×1
IV NS 1000ML BAXH (IV SOLUTION) ×1 IMPLANT
KIT ROOM TURNOVER AP CYSTO (KITS) ×2 IMPLANT
MANIFOLD NEPTUNE II (INSTRUMENTS) ×2 IMPLANT
MINIRVA 126545 ×2 IMPLANT
NS IRRIG 1000ML POUR BTL (IV SOLUTION) ×2 IMPLANT
PACK BASIC III (CUSTOM PROCEDURE TRAY) ×1
PACK SRG BSC III STRL LF ECLPS (CUSTOM PROCEDURE TRAY) ×1 IMPLANT
PAD ARMBOARD 7.5X6 YLW CONV (MISCELLANEOUS) ×2 IMPLANT
PAD TELFA 3X4 1S STER (GAUZE/BANDAGES/DRESSINGS) ×2 IMPLANT
SET BASIN LINEN APH (SET/KITS/TRAYS/PACK) ×2 IMPLANT
SET IRRIG Y TYPE TUR BLADDER L (SET/KITS/TRAYS/PACK) ×2 IMPLANT
SHEET LAVH (DRAPES) ×2 IMPLANT
YANKAUER SUCT BULB TIP 10FT TU (MISCELLANEOUS) ×2 IMPLANT

## 2017-12-06 NOTE — Discharge Instructions (Signed)
Endometrial Ablation Endometrial ablation is a procedure that destroys the thin inner layer of the lining of the uterus (endometrium). This procedure may be done:  To stop heavy periods.  To stop bleeding that is causing anemia.  To control irregular bleeding.  To treat bleeding caused by small tumors (fibroids) in the endometrium.  This procedure is often an alternative to major surgery, such as removal of the uterus and cervix (hysterectomy). As a result of this procedure:  You may not be able to have children. However, if you are premenopausal (you have not gone through menopause): ? You may still have a small chance of getting pregnant. ? You will need to use a reliable method of birth control after the procedure to prevent pregnancy.  You may stop having a menstrual period, or you may have only a small amount of bleeding during your period. Menstruation may return several years after the procedure.  Tell a health care provider about:  Any allergies you have.  All medicines you are taking, including vitamins, herbs, eye drops, creams, and over-the-counter medicines.  Any problems you or family members have had with the use of anesthetic medicines.  Any blood disorders you have.  Any surgeries you have had.  Any medical conditions you have. What are the risks? Generally, this is a safe procedure. However, problems may occur, including:  A hole (perforation) in the uterus or bowel.  Infection of the uterus, bladder, or vagina.  Bleeding.  Damage to other structures or organs.  An air bubble in the lung (air embolus).  Problems with pregnancy after the procedure.  Failure of the procedure.  Decreased ability to diagnose cancer in the endometrium.  What happens before the procedure?  You will have tests of your endometrium to make sure there are no pre-cancerous cells or cancer cells present.  You may have an ultrasound of the uterus.  You may be given  medicines to thin the endometrium.  Ask your health care provider about: ? Changing or stopping your regular medicines. This is especially important if you take diabetes medicines or blood thinners. ? Taking medicines such as aspirin and ibuprofen. These medicines can thin your blood. Do not take these medicines before your procedure if your doctor tells you not to.  Plan to have someone take you home from the hospital or clinic. What happens during the procedure?  You will lie on an exam table with your feet and legs supported as in a pelvic exam.  To lower your risk of infection: ? Your health care team will wash or sanitize their hands and put on germ-free (sterile) gloves. ? Your genital area will be washed with soap.  An IV tube will be inserted into one of your veins.  You will be given a medicine to help you relax (sedative).  A surgical instrument with a light and camera (resectoscope) will be inserted into your vagina and moved into your uterus. This allows your surgeon to see inside your uterus.  Endometrial tissue will be removed using one of the following methods: ? Radiofrequency. This method uses a radiofrequency-alternating electric current to remove the endometrium. ? Cryotherapy. This method uses extreme cold to freeze the endometrium. ? Heated-free liquid. This method uses a heated saltwater (saline) solution to remove the endometrium. ? Microwave. This method uses high-energy microwaves to heat up the endometrium and remove it. ? Thermal balloon. This method involves inserting a catheter with a balloon tip into the uterus. The balloon tip is   filled with heated fluid to remove the endometrium. The procedure may vary among health care providers and hospitals. What happens after the procedure?  Your blood pressure, heart rate, breathing rate, and blood oxygen level will be monitored until the medicines you were given have worn off.  As tissue healing occurs, you may  notice vaginal bleeding for 4-6 weeks after the procedure. You may also experience: ? Cramps. ? Thin, watery vaginal discharge that is light pink or brown in color. ? A need to urinate more frequently than usual. ? Nausea.  Do not drive for 24 hours if you were given a sedative.  Do not have sex or insert anything into your vagina until your health care provider approves. Summary  Endometrial ablation is done to treat the many causes of heavy menstrual bleeding.  The procedure may be done only after medications have been tried to control the bleeding.  Plan to have someone take you home from the hospital or clinic. This information is not intended to replace advice given to you by your health care provider. Make sure you discuss any questions you have with your health care provider. Document Released: 09/23/2004 Document Revised: 12/01/2016 Document Reviewed: 12/01/2016 Elsevier Interactive Patient Education  2017 [employer]  

## 2017-12-06 NOTE — Anesthesia Postprocedure Evaluation (Signed)
Anesthesia Post Note  Patient: Shelley Vargas  Procedure(s) Performed: DILATATION & CURETTAGE/HYSTEROSCOPY WITH Shelley ENDOMETRIAL ABLATION (N/A )  Patient location during evaluation: SICU Anesthesia Type: General Level of consciousness: awake and alert and patient cooperative Pain management: satisfactory to patient Vital Signs Assessment: post-procedure vital signs reviewed and stable Respiratory status: spontaneous breathing Cardiovascular status: stable Postop Assessment: no apparent nausea or vomiting Anesthetic complications: no     Last Vitals:  Vitals:   12/06/17 1130 12/06/17 1143  BP: 128/70 (!) 146/78  Pulse: 70 76  Resp: 12 16  Temp:  36.9 C  SpO2: 96% 95%    Last Pain:  Vitals:   12/06/17 1143  TempSrc: Oral  PainSc:                  Shelley Vargas,Shelley Vargas

## 2017-12-06 NOTE — Anesthesia Procedure Notes (Signed)
Procedure Name: LMA Insertion Date/Time: 12/06/2017 10:17 AM Performed by: Franco NonesYates, Kelechi Orgeron S, CRNA Pre-anesthesia Checklist: Patient identified, Patient being monitored, Emergency Drugs available, Timeout performed and Suction available Patient Re-evaluated:Patient Re-evaluated prior to induction Oxygen Delivery Method: Circle System Utilized Preoxygenation: Pre-oxygenation with 100% oxygen Induction Type: IV induction Ventilation: Mask ventilation without difficulty LMA: LMA inserted LMA Size: 4.0 Number of attempts: 1 Placement Confirmation: positive ETCO2 and breath sounds checked- equal and bilateral Tube secured with: Tape Dental Injury: Teeth and Oropharynx as per pre-operative assessment

## 2017-12-06 NOTE — Transfer of Care (Signed)
Immediate Anesthesia Transfer of Care Note  Patient: Shelley Vargas  Procedure(s) Performed: DILATATION & CURETTAGE/HYSTEROSCOPY WITH MINERVA ENDOMETRIAL ABLATION (N/A )  Patient Location: PACU  Anesthesia Type:General  Level of Consciousness: awake, alert  and patient cooperative  Airway & Oxygen Therapy: Patient Spontanous Breathing and non-rebreather face mask  Post-op Assessment: Report given to RN and Post -op Vital signs reviewed and stable  Post vital signs: Reviewed and stable  Last Vitals:  Vitals:   12/06/17 1000 12/06/17 1005  BP: (!) 147/94   Resp: 19 18  Temp:    SpO2: 98% 100%    Last Pain: There were no vitals filed for this visit.       Complications: No apparent anesthesia complications

## 2017-12-06 NOTE — Anesthesia Preprocedure Evaluation (Signed)
Anesthesia Evaluation  Patient identified by MRN, date of birth, ID band Patient awake    Reviewed: Allergy & Precautions, NPO status , Patient's Chart, lab work & pertinent test results  Airway Mallampati: I  TM Distance: >3 FB Neck ROM: Full    Dental  (+) Teeth Intact   Pulmonary neg pulmonary ROS,    breath sounds clear to auscultation       Cardiovascular negative cardio ROS   Rhythm:Regular Rate:Normal     Neuro/Psych negative neurological ROS  negative psych ROS   GI/Hepatic negative GI ROS, Neg liver ROS,   Endo/Other  negative endocrine ROS  Renal/GU negative Renal ROS     Musculoskeletal negative musculoskeletal ROS (+)   Abdominal   Peds  Hematology  (+) anemia ,   Anesthesia Other Findings   Reproductive/Obstetrics                             Anesthesia Physical Anesthesia Plan  ASA: II  Anesthesia Plan: General   Post-op Pain Management:    Induction: Intravenous  PONV Risk Score and Plan:   Airway Management Planned: LMA  Additional Equipment:   Intra-op Plan:   Post-operative Plan: Extubation in OR  Informed Consent: I have reviewed the patients History and Physical, chart, labs and discussed the procedure including the risks, benefits and alternatives for the proposed anesthesia with the patient or authorized representative who has indicated his/her understanding and acceptance.     Plan Discussed with:   Anesthesia Plan Comments:         Anesthesia Quick Evaluation

## 2017-12-06 NOTE — H&P (Signed)
Preoperative History and Physical  Shelley Vargas is a 42 y.o. [id number] with Patient's last menstrual period was 12/01/2017. admitted for a hysteroscopy uterine curettage endometrial ablation.  Pt has 10 month history of DUB responded to megestrol has normal sonogram Wants to proceed with ablation  patient initially told me she just started having abnormal vaginal bleeding on March 14. However during the course of conversation she actually has had a change in her menstrual periods for the past 6 months. She reports heavier bleeding and passing blood clots. She states she has not been on birth control pills for about 10 years however she was on Depo-Provera but she stopped that 1-2 years ago. She states her last normal period was February 27. She states her primary care doctor started her on birth control pills with that menses because of the blood clot she had been passing. She states she started bleeding again on March 14 and has been bleeding since. She states that is the first time she bled early and has had continued bleeding. She denies any abdominal pain but she has pain down in her inguinal/groin area. She states it's sharp and tonight it got worse. She states she's using 3 pads an hour. She was seen in the ED 2 days ago, March 19 and was prescribed a different birth control pill. She took it on March 20 for the first time. She took 2 pills. She has taken no meds for her menstrual cramping  Sonogram was normal    PMH:    Past Medical History:  Diagnosis Date  . Bronchitis   . Bronchitis   . Eczema   . Iron deficiency anemia   . Menometrorrhagia     PSH:     Past Surgical History:  Procedure Laterality Date  . rods in left leg    . TUBAL LIGATION  1997    POb/GynH:      OB History    Gravida Para Term Preterm AB Living   5 3 3   2      SAB TAB Ectopic Multiple Live Births   1 1            SH:   Social History   Tobacco Use  . Smoking status: Never Smoker  .  Smokeless tobacco: Never Used  Substance Use Topics  . Alcohol use: No    Alcohol/week: 0.6 oz    Types: 1 Glasses of wine per week    Frequency: Never  . Drug use: No    FH:    Family History  Problem Relation Age of Onset  . Hypertension Mother   . Hypertension Father      Allergies:  Allergies  Allergen Reactions  . Penicillins Other (See Comments)    Yeast infection Has patient had a PCN reaction causing immediate rash, facial/tongue/throat swelling, SOB or lightheadedness with hypotension: No Has patient had a PCN reaction causing severe rash involving mucus membranes or skin necrosis: No Has patient had a PCN reaction that required hospitalization: no Has patient had a PCN reaction occurring within the last 10 years: Yes If all of the above answers are "NO", then may proceed with Cephalosporin use.     Medications:       Current Facility-Administered Medications:  .  ceFAZolin (ANCEF) IVPB 2g/100 mL premix, 2 g, Intravenous, On Call to OR, Lazaro Arms, MD .  lactated ringers infusion, , Intravenous, Continuous, Laurene Footman, MD, Last Rate: 75 mL/hr at 12/06/17 0857  Review of  Systems:   Review of Systems  Constitutional: Negative for fever, chills, weight loss, malaise/fatigue and diaphoresis.  HENT: Negative for hearing loss, ear pain, nosebleeds, congestion, sore throat, neck pain, tinnitus and ear discharge.   Eyes: Negative for blurred vision, double vision, photophobia, pain, discharge and redness.  Respiratory: Negative for cough, hemoptysis, sputum production, shortness of breath, wheezing and stridor.   Cardiovascular: Negative for chest pain, palpitations, orthopnea, claudication, leg swelling and PND.  Gastrointestinal: Positive for abdominal pain. Negative for heartburn, nausea, vomiting, diarrhea, constipation, blood in stool and melena.  Genitourinary: Negative for dysuria, urgency, frequency, hematuria and flank pain.  Musculoskeletal: Negative  for myalgias, back pain, joint pain and falls.  Skin: Negative for itching and rash.  Neurological: Negative for dizziness, tingling, tremors, sensory change, speech change, focal weakness, seizures, loss of consciousness, weakness and headaches.  Endo/Heme/Allergies: Negative for environmental allergies and polydipsia. Does not bruise/bleed easily.  Psychiatric/Behavioral: Negative for depression, suicidal ideas, hallucinations, memory loss and substance abuse. The patient is not nervous/anxious and does not have insomnia.      PHYSICAL EXAM:  Blood pressure (!) 153/85, temperature 98.6 F (37 C), resp. rate (!) 23, last menstrual period 12/01/2017, SpO2 97 %.    Vitals reviewed. Constitutional: She is oriented to person, place, and time. She appears well-developed and well-nourished.  HENT:  Head: Normocephalic and atraumatic.  Right Ear: External ear normal.  Left Ear: External ear normal.  Nose: Nose normal.  Mouth/Throat: Oropharynx is clear and moist.  Eyes: Conjunctivae and EOM are normal. Pupils are equal, round, and reactive to light. Right eye exhibits no discharge. Left eye exhibits no discharge. No scleral icterus.  Neck: Normal range of motion. Neck supple. No tracheal deviation present. No thyromegaly present.  Cardiovascular: Normal rate, regular rhythm, normal heart sounds and intact distal pulses.  Exam reveals no gallop and no friction rub.   No murmur heard. Respiratory: Effort normal and breath sounds normal. No respiratory distress. She has no wheezes. She has no rales. She exhibits no tenderness.  GI: Soft. Bowel sounds are normal. She exhibits no distension and no mass. There is tenderness. There is no rebound and no guarding.  Genitourinary:       Vulva is normal without lesions Vagina is pink moist without discharge Cervix normal in appearance and pap is normal Uterus is normal size, contour, position, consistency, mobility, non-tender Adnexa is negative with  normal sized ovaries by sonogram  Musculoskeletal: Normal range of motion. She exhibits no edema and no tenderness.  Neurological: She is alert and oriented to person, place, and time. She has normal reflexes. She displays normal reflexes. No cranial nerve deficit. She exhibits normal muscle tone. Coordination normal.  Skin: Skin is warm and dry. No rash noted. No erythema. No pallor.  Psychiatric: She has a normal mood and affect. Her behavior is normal. Judgment and thought content normal.    Labs: Results for orders placed or performed during the hospital encounter of 12/01/17 (from the past 336 hour(s))  CBC   Collection Time: 12/01/17 11:13 AM  Result Value Ref Range   WBC 5.8 4.0 - 10.5 K/uL   RBC 4.31 3.87 - 5.11 MIL/uL   Hemoglobin 11.8 (L) 12.0 - 15.0 g/dL   HCT 30.837.1 65.736.0 - 84.646.0 %   MCV 86.1 78.0 - 100.0 fL   MCH 27.4 26.0 - 34.0 pg   MCHC 31.8 30.0 - 36.0 g/dL   RDW 96.212.7 95.211.5 - 84.115.5 %   Platelets 268 150 -  400 K/uL  Comprehensive metabolic panel   Collection Time: 12/01/17 11:13 AM  Result Value Ref Range   Sodium 137 135 - 145 mmol/L   Potassium 3.2 (L) 3.5 - 5.1 mmol/L   Chloride 108 101 - 111 mmol/L   CO2 22 22 - 32 mmol/L   Glucose, Bld 99 65 - 99 mg/dL   BUN 7 6 - 20 mg/dL   Creatinine, Ser 1.61 0.44 - 1.00 mg/dL   Calcium 8.7 (L) 8.9 - 10.3 mg/dL   Total Protein 7.7 6.5 - 8.1 g/dL   Albumin 3.9 3.5 - 5.0 g/dL   AST 15 15 - 41 U/L   ALT 12 (L) 14 - 54 U/L   Alkaline Phosphatase 77 38 - 126 U/L   Total Bilirubin 0.4 0.3 - 1.2 mg/dL   GFR calc non Af Amer >60 >60 mL/min   GFR calc Af Amer >60 >60 mL/min   Anion gap 7 5 - 15  hCG, quantitative, pregnancy   Collection Time: 12/01/17 11:13 AM  Result Value Ref Range   hCG, Beta Chain, Quant, S <1 <5 mIU/mL  Rapid HIV screen (HIV 1/2 Ab+Ag)   Collection Time: 12/01/17 11:13 AM  Result Value Ref Range   HIV-1 P24 Antigen - HIV24 NON REACTIVE NON REACTIVE   HIV 1/2 Antibodies NON REACTIVE NON REACTIVE    Interpretation (HIV Ag Ab)      A non reactive test result means that HIV 1 or HIV 2 antibodies and HIV 1 p24 antigen were not detected in the specimen.  Urinalysis, Routine w reflex microscopic   Collection Time: 12/01/17 11:13 AM  Result Value Ref Range   Color, Urine YELLOW YELLOW   APPearance CLEAR CLEAR   Specific Gravity, Urine 1.017 1.005 - 1.030   pH 6.0 5.0 - 8.0   Glucose, UA NEGATIVE NEGATIVE mg/dL   Hgb urine dipstick NEGATIVE NEGATIVE   Bilirubin Urine NEGATIVE NEGATIVE   Ketones, ur NEGATIVE NEGATIVE mg/dL   Protein, ur NEGATIVE NEGATIVE mg/dL   Nitrite NEGATIVE NEGATIVE   Leukocytes, UA NEGATIVE NEGATIVE    EKG: No orders found for this or any previous visit.  Imaging Studies: Normal diameter endometrial stripe with minimal avascular internal heterogeneity - query hemorrhage. Ovaries unremarkable.  Please see below for data measurements:   LMP: 02-08-17 Uterus: length 8.1 cm; width 6.3; height 4.9 cm Endometrium: 0.53 cm Right ovary: length 2.2 cm; transverse 1.8 cm; AP 1.5 cm Left ovary: length 2.7 cm; transverse 2.3 cm; AP 1.4 cm  Result Narrative  EXAM: [place] ENDOVAGINAL (NON-OB) DATE: 02/17/2017 12:31 PM ACCESSION: [id number] UN DICTATED: 02/17/2017 12:39 PM INTERPRETATION LOCATION: [facility]  CLINICAL INDICATION: 42 years old Female with AUB-N93.9-Abnormal uterine bleeding (AUB)   COMPARISON: None  TECHNIQUE:Ultrasound views of the pelvis were obtained endovaginally using gray scale and color Doppler imaging.  FINDINGS: No focal myometrial mass was seen.The endometrium was normal in thickness with heterogeneous fluid within it. No abnormal endometrial flow on Doppler imaging. Small nabothian cysts. Parametrial vascular engorgement.  Small cystic areas were seen within both ovaries compatible with follicles.Appropriate flow was seen in both ovaries with color and spectral doppler.No abnormal pelvic fluid was seen.       Assessment: DUB  with normal sonogram and unsatisfactory response to megestrol Patient Active Problem List   Diagnosis Date Noted  . Routine general medical examination at a health care facility 06/13/2014  . Keloid 01/07/2014  . Intertrigo 01/07/2014  . Eczema 05/28/2013  . Hand dermatitis 08/28/2012  .  Tobacco user 07/11/2011  . Overweight(278.02) 07/11/2011  . Iron deficiency anemia 07/11/2011    Plan: Hysteroscopy uterine curettage endometrial ablation using Genene Churn Eure 12/06/2017 10:01 AM

## 2017-12-06 NOTE — Op Note (Signed)
Preoperative diagnosis: Menometrorrhagia                                         Dysmenorrhea   Postoperative diagnoses: Same as above   Procedure: Hysteroscopy, uterine curettage, endometrial ablation using Minerva  Surgeon: Lazaro ArmsLuther H Burl Tauzin   Anesthesia: Laryngeal mask airway  Findings: The endometrium was normal. There were no fibroid or other abnormalities.  Description of operation: The patient was taken to the operating room and placed in the supine position. She underwent general anesthesia using the laryngeal mask airway. She was placed in the dorsal lithotomy position and prepped and draped in the usual sterile fashion. A Graves speculum was placed and the anterior cervical lip was grasped with a single-tooth tenaculum. The cervix was dilated serially to allow passage of the hysteroscope. Diagnostic hysteroscopy was performed and was found to be normal. A vigorous uterine curettage was then performed and all tissue sent to pathology for evaluation.  I then proceeded to perform the Minerva endometrial ablation.   The uterus sounded to 9 cm The handpiece was attached to the Minerva power source/machine and the handpiece passed the checklist. The array was squeezed down to remove all of the air present.  The array was then place into the endometrial cavity and deployed to a length of 6.5 cm. The handpiece confirmed appropriate width by being in the green portion of the visual dial. The cervical cuff was then inflated to the point the CO2 indicator was in the green. The endometrial integrity check was then performed and integrity sequence was confirmed x 2. The heating was then begun and carried out for a total of 2 minutes(which is standard therapy time). When the plasma cycle was finished,  the cervical cuff was deflated and the array was removed with tissue present on the silicon membrane. There was appropriate post Minerva bleeding and uterine discharge.     All of the equipment  worked well throughout the procedure.  The patient was awakened from anesthesia and taken to the recovery room in good stable condition all counts were correct. She received 2 g of Ancef and 30 mg of Toradol preoperatively. She will be discharged from the recovery room and followed up in the office in 1- 2 weeks.   She can expect 4 weeks of post procedure bloody watery discharge  Lazaro ArmsLuther H Randell Detter 12/06/2017 11:00 AM

## 2017-12-08 ENCOUNTER — Encounter (HOSPITAL_COMMUNITY): Payer: Self-pay | Admitting: Obstetrics & Gynecology

## 2017-12-14 ENCOUNTER — Ambulatory Visit (INDEPENDENT_AMBULATORY_CARE_PROVIDER_SITE_OTHER): Payer: BLUE CROSS/BLUE SHIELD | Admitting: Obstetrics & Gynecology

## 2017-12-14 ENCOUNTER — Encounter: Payer: Self-pay | Admitting: Obstetrics & Gynecology

## 2017-12-14 VITALS — BP 122/84 | HR 78 | Ht 64.0 in | Wt 200.0 lb

## 2017-12-14 DIAGNOSIS — Z9889 Other specified postprocedural states: Secondary | ICD-10-CM

## 2017-12-14 NOTE — Progress Notes (Signed)
  HPI: Patient returns for routine postoperative follow-up having undergone Minerva hysteroscopy uterine curettage ablation on 12/06/2017.  The patient's immediate postoperative recovery has been unremarkable. Since hospital discharge the patient reports no problems watery discharge.   Current Outpatient Medications: aspirin-acetaminophen-caffeine (EXCEDRIN MIGRAINE) 250-250-65 MG tablet, Take 2 tablets by mouth daily as needed for headache., Disp: , Rfl:   No current facility-administered medications for this visit.     Blood pressure 122/84, pulse 78, height 5\' 4"  (1.626 m), weight 200 lb (90.7 kg), last menstrual period 12/01/2017.  Physical Exam: Normal post ablation exam  Diagnostic Tests:   Pathology: benign  Impression: S/p Minerva endometrial abation  Plan:   Follow up: 1  years  Lazaro ArmsLuther H Eure, MD

## 2018-06-21 ENCOUNTER — Emergency Department (HOSPITAL_COMMUNITY)
Admission: EM | Admit: 2018-06-21 | Discharge: 2018-06-22 | Disposition: A | Discharge status: Home / Self Care | Payer: BLUE CROSS/BLUE SHIELD | Attending: Emergency Medicine | Admitting: Emergency Medicine

## 2018-06-21 ENCOUNTER — Other Ambulatory Visit: Payer: Self-pay

## 2018-06-21 ENCOUNTER — Encounter (HOSPITAL_COMMUNITY): Payer: Self-pay | Admitting: Emergency Medicine

## 2018-06-21 DIAGNOSIS — N898 Other specified noninflammatory disorders of vagina: Secondary | ICD-10-CM | POA: Diagnosis not present

## 2018-06-21 DIAGNOSIS — R112 Nausea with vomiting, unspecified: Secondary | ICD-10-CM

## 2018-06-21 DIAGNOSIS — M545 Low back pain, unspecified: Secondary | ICD-10-CM

## 2018-06-21 DIAGNOSIS — R197 Diarrhea, unspecified: Secondary | ICD-10-CM

## 2018-06-21 LAB — PREGNANCY, URINE: Preg Test, Ur: NEGATIVE

## 2018-06-21 LAB — WET PREP, GENITAL
Clue Cells Wet Prep HPF POC: NONE SEEN
SPERM: NONE SEEN
Trich, Wet Prep: NONE SEEN
Yeast Wet Prep HPF POC: NONE SEEN

## 2018-06-21 LAB — URINALYSIS, ROUTINE W REFLEX MICROSCOPIC
BILIRUBIN URINE: NEGATIVE
GLUCOSE, UA: NEGATIVE mg/dL
HGB URINE DIPSTICK: NEGATIVE
Ketones, ur: NEGATIVE mg/dL
LEUKOCYTES UA: NEGATIVE
NITRITE: NEGATIVE
Protein, ur: 100 mg/dL — AB
SPECIFIC GRAVITY, URINE: 1.02 (ref 1.005–1.030)
pH: 7 (ref 5.0–8.0)

## 2018-06-21 LAB — COMPREHENSIVE METABOLIC PANEL
ALT: 28 U/L (ref 0–44)
AST: 22 U/L (ref 15–41)
Albumin: 4.5 g/dL (ref 3.5–5.0)
Alkaline Phosphatase: 80 U/L (ref 38–126)
Anion gap: 7 (ref 5–15)
BILIRUBIN TOTAL: 0.5 mg/dL (ref 0.3–1.2)
BUN: 9 mg/dL (ref 6–20)
CO2: 27 mmol/L (ref 22–32)
CREATININE: 0.68 mg/dL (ref 0.44–1.00)
Calcium: 8.9 mg/dL (ref 8.9–10.3)
Chloride: 106 mmol/L (ref 98–111)
Glucose, Bld: 107 mg/dL — ABNORMAL HIGH (ref 70–99)
Potassium: 3.5 mmol/L (ref 3.5–5.1)
Sodium: 140 mmol/L (ref 135–145)
TOTAL PROTEIN: 8.3 g/dL — AB (ref 6.5–8.1)

## 2018-06-21 LAB — CBC
HCT: 39.7 % (ref 36.0–46.0)
Hemoglobin: 12.9 g/dL (ref 12.0–15.0)
MCH: 28 pg (ref 26.0–34.0)
MCHC: 32.5 g/dL (ref 30.0–36.0)
MCV: 86.3 fL (ref 78.0–100.0)
PLATELETS: 247 10*3/uL (ref 150–400)
RBC: 4.6 MIL/uL (ref 3.87–5.11)
RDW: 12.8 % (ref 11.5–15.5)
WBC: 7.1 10*3/uL (ref 4.0–10.5)

## 2018-06-21 LAB — LIPASE, BLOOD: Lipase: 32 U/L (ref 11–51)

## 2018-06-21 MED ORDER — LOPERAMIDE HCL 2 MG PO CAPS: 2.0000 mg | ORAL_CAPSULE | Freq: Four times a day (QID) | ORAL | 0 refills | Status: DC | PRN

## 2018-06-21 MED ORDER — IBUPROFEN 800 MG PO TABS: 800.0000 mg | ORAL_TABLET | Freq: Three times a day (TID) | ORAL | 0 refills | Status: DC | PRN

## 2018-06-21 MED ORDER — ONDANSETRON 4 MG PO TBDP: 4.0000 mg | ORAL_TABLET | Freq: Three times a day (TID) | ORAL | 0 refills | Status: DC | PRN

## 2018-06-21 MED ORDER — KETOROLAC TROMETHAMINE 30 MG/ML IJ SOLN
30.0000 mg | Freq: Once | INTRAMUSCULAR | Status: AC
  Administered 2018-06-21: 30 mg via INTRAMUSCULAR
  Filled 2018-06-21: qty 1

## 2018-06-21 NOTE — ED Provider Notes (Signed)
Emergency Department Provider Note   I have reviewed the triage vital signs and the nursing notes.   HISTORY  Chief Complaint Abdominal Pain   HPI Shelley Vargas is a 42 y.o. female resents to the emergency department for evaluation of lower back pain with associated nausea and diarrhea.  The patient does report some additional vaginal discharge.  She has started her menstrual cycle which was short due to her recent uterine ablation.  She states that the nausea, vomiting, diarrhea have improved but she is having lower back and suprapubic abdominal pain.  Denies any fevers or chills.  No dysuria, hesitancy, urgency.  No radiation of symptoms or other modifying factors.   Past Medical History:  Diagnosis Date  . Bronchitis   . Bronchitis   . Eczema   . Iron deficiency anemia   . Menometrorrhagia     Patient Active Problem List   Diagnosis Date Noted  . Routine general medical examination at a health care facility 06/13/2014  . Keloid 01/07/2014  . Intertrigo 01/07/2014  . Eczema 05/28/2013  . Hand dermatitis 08/28/2012  . Tobacco user 07/11/2011  . Overweight(278.02) 07/11/2011  . Iron deficiency anemia 07/11/2011    Past Surgical History:  Procedure Laterality Date  . DILITATION & CURRETTAGE/HYSTROSCOPY WITH NOVASURE ABLATION N/A 12/06/2017   Procedure: DILATATION & CURETTAGE/HYSTEROSCOPY WITH MINERVA ENDOMETRIAL ABLATION;  Surgeon: Lazaro Arms, MD;  Location: [facility];  Service: Gynecology;  Laterality: N/A;  . rods in left leg    . TUBAL LIGATION  1997   Allergies Penicillins  Family History  Problem Relation Age of Onset  . Hypertension Mother   . Hypertension Father     Social History Social History   Tobacco Use  . Smoking status: Never Smoker  . Smokeless tobacco: Never Used  Substance Use Topics  . Alcohol use: No    Alcohol/week: 0.6 oz    Types: 1 Glasses of wine per week    Frequency: Never  . Drug use: No    Review of  Systems  Constitutional: No fever/chills Eyes: No visual changes. ENT: No sore throat. Cardiovascular: Denies chest pain. Respiratory: Denies shortness of breath. Gastrointestinal: Positive suprapubic abdominal pain.  Positive nausea, vomiting, and diarrhea.  No constipation. Genitourinary: Negative for dysuria. Positive vaginal discharge.  Musculoskeletal: Positive for back pain. Skin: Negative for rash. Neurological: Negative for headaches, focal weakness or numbness.  10-point ROS otherwise negative.  ____________________________________________   PHYSICAL EXAM:  VITAL SIGNS: ED Triage Vitals  Enc Vitals Group     BP 06/21/18 2005 (!) 151/89     Pulse Rate 06/21/18 2005 (!) 58     Resp 06/21/18 2005 18     Temp 06/21/18 2005 98.7 F (37.1 C)     Temp Source 06/21/18 2005 Oral     SpO2 06/21/18 2005 100 %     Weight 06/21/18 2006 185 lb (83.9 kg)     Height 06/21/18 2006 5\' 4"  (1.626 m)     Pain Score 06/21/18 2005 7   Constitutional: Alert and oriented. Well appearing and in no acute distress. Eyes: Conjunctivae are normal. Head: Atraumatic. Nose: No congestion/rhinnorhea. Mouth/Throat: Mucous membranes are moist.  Oropharynx non-erythematous. Neck: No stridor. Cardiovascular: Normal rate, regular rhythm. Good peripheral circulation. Grossly normal heart sounds.   Respiratory: Normal respiratory effort.  No retractions. Lungs CTAB. Gastrointestinal: Soft and nontender. No distention.  Genitourinary: Exam performed with chaperone. Mild discharge and trace blood. No CMT or adnexal tenderness/fullness.  Musculoskeletal: No  lower extremity tenderness nor edema. No gross deformities of extremities. No midline thoracic or lumbar spine pain.  Neurologic:  Normal speech and language. No gross focal neurologic deficits are appreciated.  Skin:  Skin is warm, dry and intact. No rash noted.  ____________________________________________   LABS (all labs ordered are listed,  but only abnormal results are displayed)  Labs Reviewed  WET PREP, GENITAL - Abnormal; Notable for the following components:      Result Value   WBC, Wet Prep HPF POC FEW (*)    All other components within normal limits  COMPREHENSIVE METABOLIC PANEL - Abnormal; Notable for the following components:   Glucose, Bld 107 (*)    Total Protein 8.3 (*)    All other components within normal limits  URINALYSIS, ROUTINE W REFLEX MICROSCOPIC - Abnormal; Notable for the following components:   APPearance CLOUDY (*)    Protein, ur 100 (*)    Bacteria, UA RARE (*)    All other components within normal limits  LIPASE, BLOOD  CBC  PREGNANCY, URINE  GC/CHLAMYDIA PROBE AMP ([place]) NOT [facility]   ____________________________________________  RADIOLOGY  None ____________________________________________   PROCEDURES  Procedure(s) performed:   Procedures  None ____________________________________________   INITIAL IMPRESSION / ASSESSMENT AND PLAN / ED COURSE  Pertinent labs & imaging results that were available during my care of the patient were reviewed by me and considered in my medical decision making (see chart for details).  Patient presents to the emergency department for evaluation of lower back pain with associated nausea, vomiting, diarrhea.  He has had some additional vaginal discharge.  She has no midline pain or concern for neurological process or cord impingement.  Symptoms seem to be more suprapubic and pelvic in origin.  Her pelvic exam was largely unremarkable.  No concern for PID.  Her wet prep is normal.  Pregnancy negative.  She has no anterior abdominal tenderness on exam.  Do not believe she warrants CT imaging of the abdomen and pelvis.  She is not experiencing nausea or diarrhea at this time.  Plan to discharge home with medication for symptom management of likely viral process versus MSK etiology.  UA reviewed without evidence of UTI.  Patient's lab work  unremarkable.   At this time, I do not feel there is any life-threatening condition present. I have reviewed and discussed all results (EKG, imaging, lab, urine as appropriate), exam findings with patient. I have reviewed nursing notes and appropriate previous records.  I feel the patient is safe to be discharged home without further emergent workup. Discussed usual and customary return precautions. Patient and family (if present) verbalize understanding and are comfortable with this plan.  Patient will follow-up with their primary care provider. If they do not have a primary care provider, information for follow-up has been provided to them. All questions have been answered.  ____________________________________________  FINAL CLINICAL IMPRESSION(S) / ED DIAGNOSES  Final diagnoses:  Acute bilateral low back pain without sciatica  Non-intractable vomiting with nausea, unspecified vomiting type  Diarrhea, unspecified type  Vaginal discharge     MEDICATIONS GIVEN DURING THIS VISIT:  Medications  ketorolac (TORADOL) 30 MG/ML injection 30 mg (has no administration in time range)     NEW OUTPATIENT MEDICATIONS STARTED DURING THIS VISIT:  New Prescriptions   IBUPROFEN (ADVIL,MOTRIN) 800 MG TABLET    Take 1 tablet (800 mg total) by mouth every 8 (eight) hours as needed.   LOPERAMIDE (IMODIUM) 2 MG CAPSULE  Take 1 capsule (2 mg total) by mouth 4 (four) times daily as needed for diarrhea or loose stools.   ONDANSETRON (ZOFRAN ODT) 4 MG DISINTEGRATING TABLET    Take 1 tablet (4 mg total) by mouth every 8 (eight) hours as needed for nausea or vomiting.    Note:  This document was prepared using Dragon voice recognition software and may include unintentional dictation errors.  Alona Bene, MD Emergency Medicine    Per Beagley, Arlyss Repress, MD 06/21/18 2350

## 2018-06-21 NOTE — Discharge Instructions (Signed)
You have been seen in the Emergency Department (ED)  today for back pain.  Your workup and exam have not shown any acute abnormalities and you are likely suffering from muscle strain or possible viral infection. Please take Motrin (ibuprofen) as needed for your pain according to the instructions written on the box.  Alternatively, for the next five days you can take 600mg  three times daily with meals (it may upset your stomach).  Please follow up with your doctor as soon as possible regarding today's ED visit and your back pain.  Return to the ED for worsening back pain, fever, weakness or numbness of either leg, or if you develop either (1) an inability to urinate or have bowel movements, or (2) loss of your ability to control your bathroom functions (if you start having "accidents"), or if you develop other new symptoms that concern you.

## 2018-06-21 NOTE — ED Triage Notes (Signed)
Pt c/o abd/back pain with n/v x 2 days. Pt also c/o urinary frequency.

## 2018-06-25 LAB — GC/CHLAMYDIA PROBE AMP (~~LOC~~) NOT AT ARMC
Chlamydia: NEGATIVE
Neisseria Gonorrhea: NEGATIVE

## 2018-07-19 ENCOUNTER — Ambulatory Visit (INDEPENDENT_AMBULATORY_CARE_PROVIDER_SITE_OTHER): Payer: BLUE CROSS/BLUE SHIELD | Admitting: Obstetrics & Gynecology

## 2018-07-19 ENCOUNTER — Other Ambulatory Visit: Payer: Self-pay

## 2018-07-19 ENCOUNTER — Encounter: Payer: Self-pay | Admitting: Obstetrics & Gynecology

## 2018-07-19 ENCOUNTER — Telehealth: Payer: Self-pay | Admitting: Obstetrics & Gynecology

## 2018-07-19 ENCOUNTER — Telehealth: Payer: Self-pay | Admitting: *Deleted

## 2018-07-19 VITALS — BP 147/89 | HR 69 | Ht 64.0 in | Wt 192.0 lb

## 2018-07-19 DIAGNOSIS — N857 Hematometra: Secondary | ICD-10-CM | POA: Diagnosis not present

## 2018-07-19 NOTE — Telephone Encounter (Signed)
Pt called back and  Dr. Despina HiddenEure advised he could see her this afternoon. JSY

## 2018-07-19 NOTE — Telephone Encounter (Signed)
Per our conversation, we will work her in this afternoon

## 2018-07-19 NOTE — Progress Notes (Signed)
Chief Complaint  Patient presents with  . Abdominal Pain      42 y.o. [id number] No LMP recorded. Patient has had an ablation. The current method of family planning is tubal ligation.  Outpatient Encounter Medications as of 07/19/2018  Medication Sig  . aspirin-acetaminophen-caffeine (EXCEDRIN MIGRAINE) 250-250-65 MG tablet Take 2 tablets by mouth daily as needed for headache.  . ibuprofen (ADVIL,MOTRIN) 800 MG tablet Take 1 tablet (800 mg total) by mouth every 8 (eight) hours as needed.  . loperamide (IMODIUM) 2 MG capsule Take 1 capsule (2 mg total) by mouth 4 (four) times daily as needed for diarrhea or loose stools.  . ondansetron (ZOFRAN ODT) 4 MG disintegrating tablet Take 1 tablet (4 mg total) by mouth every 8 (eight) hours as needed for nausea or vomiting.   No facility-administered encounter medications on file as of 07/19/2018.     Subjective Shelley Vargas called in today with 1 day history of severe midline pelvic cramping Same thing happened 1 month ago exactly, had 1 spot of blood S/p endometrial ablation 9 months ago Past Medical History:  Diagnosis Date  . Bronchitis   . Bronchitis   . Eczema   . Iron deficiency anemia   . Menometrorrhagia     Past Surgical History:  Procedure Laterality Date  . DILITATION & CURRETTAGE/HYSTROSCOPY WITH NOVASURE ABLATION N/A 12/06/2017   Procedure: DILATATION & CURETTAGE/HYSTEROSCOPY WITH MINERVA ENDOMETRIAL ABLATION;  Surgeon: Lazaro ArmsEure, Danija Gosa H, MD;  Location: [facility];  Service: Gynecology;  Laterality: N/A;  . rods in left leg    . TUBAL LIGATION  1997    OB History    Gravida  5   Para  3   Term  3   Preterm      AB  2   Living        SAB  1   TAB  1   Ectopic      Multiple      Live Births              Allergies  Allergen Reactions  . Penicillins Other (See Comments)    Yeast infection Has patient had a PCN reaction causing immediate rash, facial/tongue/throat swelling, SOB or lightheadedness  with hypotension: No Has patient had a PCN reaction causing severe rash involving mucus membranes or skin necrosis: No Has patient had a PCN reaction that required hospitalization: no Has patient had a PCN reaction occurring within the last 10 years: Yes If all of the above answers are "NO", then may proceed with Cephalosporin use.     Social History   Socioeconomic History  . Marital status: Married    Spouse name: Not on file  . Number of children: Not on file  . Years of education: Not on file  . Highest education level: Not on file  Occupational History  . Not on file  Social Needs  . Financial resource strain: Not on file  . Food insecurity:    Worry: Not on file    Inability: Not on file  . Transportation needs:    Medical: Not on file    Non-medical: Not on file  Tobacco Use  . Smoking status: Never Smoker  . Smokeless tobacco: Never Used  Substance and Sexual Activity  . Alcohol use: No    Alcohol/week: 1.0 standard drinks    Types: 1 Glasses of wine per week    Frequency: Never  . Drug use: No  . Sexual activity:  Yes    Birth control/protection: None  Lifestyle  . Physical activity:    Days per week: Not on file    Minutes per session: Not on file  . Stress: Not on file  Relationships  . Social connections:    Talks on phone: Not on file    Gets together: Not on file    Attends religious service: Not on file    Active member of club or organization: Not on file    Attends meetings of clubs or organizations: Not on file    Relationship status: Not on file  Other Topics Concern  . Not on file  Social History Narrative  . Not on file    Family History  Problem Relation Age of Onset  . Hypertension Mother   . Hypertension Father     Medications:       Current Outpatient Medications:  .  aspirin-acetaminophen-caffeine (EXCEDRIN MIGRAINE) 250-250-65 MG tablet, Take 2 tablets by mouth daily as needed for headache., Disp: , Rfl:  .  ibuprofen  (ADVIL,MOTRIN) 800 MG tablet, Take 1 tablet (800 mg total) by mouth every 8 (eight) hours as needed., Disp: 21 tablet, Rfl: 0 .  loperamide (IMODIUM) 2 MG capsule, Take 1 capsule (2 mg total) by mouth 4 (four) times daily as needed for diarrhea or loose stools., Disp: 12 capsule, Rfl: 0 .  ondansetron (ZOFRAN ODT) 4 MG disintegrating tablet, Take 1 tablet (4 mg total) by mouth every 8 (eight) hours as needed for nausea or vomiting., Disp: 20 tablet, Rfl: 0  Objective Blood pressure (!) 147/89, pulse 69, height 5\' 4"  (1.626 m), weight 192 lb (87.1 kg).  No blood in vaginal vault Paracervical block placed Serial cervical dilation performed and hematometra was relieved, probably 20 cc of very dark blood total Pt got immediate relief of her severe cramps  Pertinent ROS No burning with urination, frequency or urgency No nausea, vomiting or diarrhea Nor fever chills or other constitutional symptoms   Labs or studies     Impression Diagnoses this Encounter::   ICD-10-CM   1. Hematometra S/P endometrial ablation N85.7     Established relevant diagnosis(es):   Plan/Recommendations: No orders of the defined types were placed in this encounter.   Labs or Scans Ordered: No orders of the defined types were placed in this encounter.   Management:: S/p cervical dilation today to relieve the heamtometra Follow up 1 month to check cervical patency and symptoms  Follow up Return in about 1 month (around 08/19/2018) for Follow up, with Dr Despina Hidden.        All questions were answered.

## 2018-08-20 ENCOUNTER — Ambulatory Visit (INDEPENDENT_AMBULATORY_CARE_PROVIDER_SITE_OTHER): Payer: BLUE CROSS/BLUE SHIELD | Admitting: Obstetrics & Gynecology

## 2018-08-20 ENCOUNTER — Other Ambulatory Visit: Payer: Self-pay

## 2018-08-20 ENCOUNTER — Encounter (INDEPENDENT_AMBULATORY_CARE_PROVIDER_SITE_OTHER): Payer: Self-pay

## 2018-08-20 ENCOUNTER — Encounter: Payer: Self-pay | Admitting: Obstetrics & Gynecology

## 2018-08-20 VITALS — BP 148/93 | HR 65 | Ht 64.0 in | Wt 197.0 lb

## 2018-08-20 DIAGNOSIS — N857 Hematometra: Secondary | ICD-10-CM | POA: Diagnosis not present

## 2018-08-20 NOTE — Progress Notes (Signed)
Chief Complaint  Patient presents with  . Follow-up      42 y.o. [id number] No LMP recorded. Patient has had an ablation. The current method of family planning is tubal ligation.  Outpatient Encounter Medications as of 08/20/2018  Medication Sig  . aspirin-acetaminophen-caffeine (EXCEDRIN MIGRAINE) 250-250-65 MG tablet Take 2 tablets by mouth daily as needed for headache.  . ibuprofen (ADVIL,MOTRIN) 800 MG tablet Take 1 tablet (800 mg total) by mouth every 8 (eight) hours as needed.  . ondansetron (ZOFRAN ODT) 4 MG disintegrating tablet Take 1 tablet (4 mg total) by mouth every 8 (eight) hours as needed for nausea or vomiting.  . loperamide (IMODIUM) 2 MG capsule Take 1 capsule (2 mg total) by mouth 4 (four) times daily as needed for diarrhea or loose stools. (Patient not taking: Reported on 08/20/2018)   No facility-administered encounter medications on file as of 08/20/2018.     Subjective Follow up from 07/19/2018 Had post ablation hemtometra, bled after dilation but has resolved and has had no similar pain since then Had bleeding after the dilation but none since that time Past Medical History:  Diagnosis Date  . Bronchitis   . Bronchitis   . Eczema   . Iron deficiency anemia   . Menometrorrhagia     Past Surgical History:  Procedure Laterality Date  . DILITATION & CURRETTAGE/HYSTROSCOPY WITH NOVASURE ABLATION N/A 12/06/2017   Procedure: DILATATION & CURETTAGE/HYSTEROSCOPY WITH MINERVA ENDOMETRIAL ABLATION;  Surgeon: Lazaro ArmsEure, Krishay Faro H, MD;  Location: [facility];  Service: Gynecology;  Laterality: N/A;  . rods in left leg    . TUBAL LIGATION  1997    OB History    Gravida  5   Para  3   Term  3   Preterm      AB  2   Living        SAB  1   TAB  1   Ectopic      Multiple      Live Births              Allergies  Allergen Reactions  . Penicillins Other (See Comments)    Yeast infection Has patient had a PCN reaction causing immediate rash,  facial/tongue/throat swelling, SOB or lightheadedness with hypotension: No Has patient had a PCN reaction causing severe rash involving mucus membranes or skin necrosis: No Has patient had a PCN reaction that required hospitalization: no Has patient had a PCN reaction occurring within the last 10 years: Yes If all of the above answers are "NO", then may proceed with Cephalosporin use.     Social History   Socioeconomic History  . Marital status: Married    Spouse name: Not on file  . Number of children: Not on file  . Years of education: Not on file  . Highest education level: Not on file  Occupational History  . Not on file  Social Needs  . Financial resource strain: Not on file  . Food insecurity:    Worry: Not on file    Inability: Not on file  . Transportation needs:    Medical: Not on file    Non-medical: Not on file  Tobacco Use  . Smoking status: Never Smoker  . Smokeless tobacco: Never Used  Substance and Sexual Activity  . Alcohol use: No    Alcohol/week: 1.0 standard drinks    Types: 1 Glasses of wine per week    Frequency: Never  . Drug use:  No  . Sexual activity: Yes    Birth control/protection: None  Lifestyle  . Physical activity:    Days per week: Not on file    Minutes per session: Not on file  . Stress: Not on file  Relationships  . Social connections:    Talks on phone: Not on file    Gets together: Not on file    Attends religious service: Not on file    Active member of club or organization: Not on file    Attends meetings of clubs or organizations: Not on file    Relationship status: Not on file  Other Topics Concern  . Not on file  Social History Narrative  . Not on file    Family History  Problem Relation Age of Onset  . Hypertension Mother   . Hypertension Father     Medications:       Current Outpatient Medications:  .  aspirin-acetaminophen-caffeine (EXCEDRIN MIGRAINE) 250-250-65 MG tablet, Take 2 tablets by mouth daily as  needed for headache., Disp: , Rfl:  .  ibuprofen (ADVIL,MOTRIN) 800 MG tablet, Take 1 tablet (800 mg total) by mouth every 8 (eight) hours as needed., Disp: 21 tablet, Rfl: 0 .  ondansetron (ZOFRAN ODT) 4 MG disintegrating tablet, Take 1 tablet (4 mg total) by mouth every 8 (eight) hours as needed for nausea or vomiting., Disp: 20 tablet, Rfl: 0 .  loperamide (IMODIUM) 2 MG capsule, Take 1 capsule (2 mg total) by mouth 4 (four) times daily as needed for diarrhea or loose stools. (Patient not taking: Reported on 08/20/2018), Disp: 12 capsule, Rfl: 0  Objective Blood pressure (!) 148/93, pulse 65, height 5\' 4"  (1.626 m), weight 197 lb (89.4 kg).  Normal vagina no blood cervix is visibly open unlike before No dilators used today  Pertinent ROS No burning with urination, frequency or urgency No nausea, vomiting or diarrhea Nor fever chills or other constitutional symptoms   Labs or studies     Impression Diagnoses this Encounter::   ICD-10-CM   1. Hematometra S/P endometrial ablation N85.7     Established relevant diagnosis(es):   Plan/Recommendations: No orders of the defined types were placed in this encounter.   Labs or Scans Ordered: No orders of the defined types were placed in this encounter.   Management:: >no further follow up needed >if recurs then come in for evaluation  Follow up Return if symptoms worsen or fail to improve.        All questions were answered.

## 2018-10-24 ENCOUNTER — Ambulatory Visit (INDEPENDENT_AMBULATORY_CARE_PROVIDER_SITE_OTHER): Payer: BLUE CROSS/BLUE SHIELD | Admitting: Obstetrics & Gynecology

## 2018-10-24 ENCOUNTER — Encounter: Payer: Self-pay | Admitting: Obstetrics & Gynecology

## 2018-10-24 VITALS — BP 135/82 | HR 78 | Ht 64.0 in | Wt 198.0 lb

## 2018-10-24 DIAGNOSIS — M62830 Muscle spasm of back: Secondary | ICD-10-CM | POA: Diagnosis not present

## 2018-10-24 MED ORDER — CYCLOBENZAPRINE HCL 10 MG PO TABS: 10.0000 mg | ORAL_TABLET | Freq: Three times a day (TID) | ORAL | 1 refills | Status: DC | PRN

## 2018-10-24 NOTE — Progress Notes (Signed)
Chief Complaint  Patient presents with  . Abdominal Pain    and back pain      42 y.o. [id number] No LMP recorded. Patient has had an ablation. The current method of family planning is tubal ligation.  Outpatient Encounter Medications as of 10/24/2018  Medication Sig  . aspirin-acetaminophen-caffeine (EXCEDRIN MIGRAINE) 250-250-65 MG tablet Take 2 tablets by mouth daily as needed for headache.  . ibuprofen (ADVIL,MOTRIN) 800 MG tablet Take 1 tablet (800 mg total) by mouth every 8 (eight) hours as needed.  . cyclobenzaprine (FLEXERIL) 10 MG tablet Take 1 tablet (10 mg total) by mouth every 8 (eight) hours as needed for muscle spasms.  . ondansetron (ZOFRAN ODT) 4 MG disintegrating tablet Take 1 tablet (4 mg total) by mouth every 8 (eight) hours as needed for nausea or vomiting. (Patient not taking: Reported on 10/24/2018)  . [DISCONTINUED] loperamide (IMODIUM) 2 MG capsule Take 1 capsule (2 mg total) by mouth 4 (four) times daily as needed for diarrhea or loose stools. (Patient not taking: Reported on 08/20/2018)   No facility-administered encounter medications on file as of 10/24/2018.     Subjective Pt with low back pain severe of=ver the past week or so, right > left Difficult to work No bleeding No pelvic pain Made worse with twisting Past Medical History:  Diagnosis Date  . Bronchitis   . Bronchitis   . Eczema   . Iron deficiency anemia   . Menometrorrhagia     Past Surgical History:  Procedure Laterality Date  . DILITATION & CURRETTAGE/HYSTROSCOPY WITH NOVASURE ABLATION N/A 12/06/2017   Procedure: DILATATION & CURETTAGE/HYSTEROSCOPY WITH MINERVA ENDOMETRIAL ABLATION;  Surgeon: Lazaro ArmsEure, Keithon Mccoin H, MD;  Location: [facility];  Service: Gynecology;  Laterality: N/A;  . rods in left leg    . TUBAL LIGATION  1997    OB History    Gravida  5   Para  3   Term  3   Preterm      AB  2   Living        SAB  1   TAB  1   Ectopic      Multiple      Live Births             Allergies  Allergen Reactions  . Penicillins Other (See Comments)    Yeast infection Has patient had a PCN reaction causing immediate rash, facial/tongue/throat swelling, SOB or lightheadedness with hypotension: No Has patient had a PCN reaction causing severe rash involving mucus membranes or skin necrosis: No Has patient had a PCN reaction that required hospitalization: no Has patient had a PCN reaction occurring within the last 10 years: Yes If all of the above answers are "NO", then may proceed with Cephalosporin use.     Social History   Socioeconomic History  . Marital status: Married    Spouse name: Not on file  . Number of children: Not on file  . Years of education: Not on file  . Highest education level: Not on file  Occupational History  . Not on file  Social Needs  . Financial resource strain: Not on file  . Food insecurity:    Worry: Not on file    Inability: Not on file  . Transportation needs:    Medical: Not on file    Non-medical: Not on file  Tobacco Use  . Smoking status: Never Smoker  . Smokeless tobacco: Never Used  Substance and Sexual Activity  .  Alcohol use: No    Alcohol/week: 1.0 standard drinks    Types: 1 Glasses of wine per week    Frequency: Never  . Drug use: No  . Sexual activity: Yes    Birth control/protection: None  Lifestyle  . Physical activity:    Days per week: Not on file    Minutes per session: Not on file  . Stress: Not on file  Relationships  . Social connections:    Talks on phone: Not on file    Gets together: Not on file    Attends religious service: Not on file    Active member of club or organization: Not on file    Attends meetings of clubs or organizations: Not on file    Relationship status: Not on file  Other Topics Concern  . Not on file  Social History Narrative  . Not on file    Family History  Problem Relation Age of Onset  . Hypertension Mother   . Hypertension Father      Medications:       Current Outpatient Medications:  .  aspirin-acetaminophen-caffeine (EXCEDRIN MIGRAINE) 250-250-65 MG tablet, Take 2 tablets by mouth daily as needed for headache., Disp: , Rfl:  .  ibuprofen (ADVIL,MOTRIN) 800 MG tablet, Take 1 tablet (800 mg total) by mouth every 8 (eight) hours as needed., Disp: 21 tablet, Rfl: 0 .  cyclobenzaprine (FLEXERIL) 10 MG tablet, Take 1 tablet (10 mg total) by mouth every 8 (eight) hours as needed for muscle spasms., Disp: 30 tablet, Rfl: 1 .  ondansetron (ZOFRAN ODT) 4 MG disintegrating tablet, Take 1 tablet (4 mg total) by mouth every 8 (eight) hours as needed for nausea or vomiting. (Patient not taking: Reported on 10/24/2018), Disp: 20 tablet, Rfl: 0  Objective Blood pressure 135/82, pulse 78, height 5\' 4"  (1.626 m), weight 198 lb (89.8 kg).  +triggers PSIS r>L Pelvic exam normal  Pertinent ROS   Labs or studies Trigger Point Injection   Pre-operative diagnosis: myofascial pain  Post-operative diagnosis: myofascial pain  After risks and benefits were explained including bleeding, infection, worsening of the pain, damage to the area being injected, weakness, allergic reaction to medications, vascular injection, and nerve damage, signed consent was obtained.  All questions were answered.    The area of the trigger point was identified and the skin prepped three times with alcohol and the alcohol allowed to dry.  Next, a 25 gauge 0.5 inch needle was placed in the area of the trigger point.  Once reproduction of the pain was elicited and negative aspiration confirmed, the trigger point was injected and the needle removed.    The patient did tolerate the procedure well and there were not complications.    Medication used:  20 cc total 0.5% marcaine    Trigger points injected: 2    Trigger point(s) location(s):  bilateral     Impression Diagnoses this Encounter::   ICD-10-CM   1. Back spasm M62.830    lumbar, right>left     Established relevant diagnosis(es):   Plan/Recommendations: Meds ordered this encounter  Medications  . cyclobenzaprine (FLEXERIL) 10 MG tablet    Sig: Take 1 tablet (10 mg total) by mouth every 8 (eight) hours as needed for muscle spasms.    Dispense:  30 tablet    Refill:  1    Labs or Scans Ordered: No orders of the defined types were placed in this encounter.   Management:: >s/p trigger point injections, bilaterally >flexeril prn >  elf care discussed with patient  Follow up Return if symptoms worsen or fail to improve.        All questions were answered.

## 2019-08-30 ENCOUNTER — Other Ambulatory Visit: Payer: Self-pay

## 2019-08-30 DIAGNOSIS — Z20822 Contact with and (suspected) exposure to covid-19: Secondary | ICD-10-CM

## 2019-08-31 LAB — NOVEL CORONAVIRUS, NAA: SARS-CoV-2, NAA: NOT DETECTED

## 2019-09-25 ENCOUNTER — Ambulatory Visit (INDEPENDENT_AMBULATORY_CARE_PROVIDER_SITE_OTHER): Payer: BLUE CROSS/BLUE SHIELD | Admitting: Obstetrics and Gynecology

## 2019-09-25 ENCOUNTER — Other Ambulatory Visit: Payer: Self-pay

## 2019-09-25 ENCOUNTER — Encounter: Payer: Self-pay | Admitting: Obstetrics and Gynecology

## 2019-09-25 VITALS — BP 115/78 | HR 64 | Ht 64.0 in | Wt 190.2 lb

## 2019-09-25 DIAGNOSIS — M549 Dorsalgia, unspecified: Secondary | ICD-10-CM | POA: Diagnosis not present

## 2019-09-25 LAB — POCT URINALYSIS DIPSTICK OB
Blood, UA: NEGATIVE
Glucose, UA: NEGATIVE
Ketones, UA: NEGATIVE
Leukocytes, UA: NEGATIVE
Nitrite, UA: NEGATIVE
POC,PROTEIN,UA: NEGATIVE

## 2019-09-25 NOTE — Progress Notes (Addendum)
Patient ID: Shelley Vargas, female   DOB: 27-Jun-1976, 43 y.o.   MRN: [id number]    [facility] Visit  @DATE @            Patient name: Shelley Vargas MRN [id number]  Date of birth: 1976-10-21  CC & HPI:  Shelley Vargas is a 43 y.o. female presenting today for 6 month f/u, she has back & abdominal pain. She has had an ablation and tubal ligation. She believes thatt her ablation wasn't a success due to her uterus still tight and has severe bleeding during period.  Sometimes she has a menstrual period that lasts 7 days and she misses some work due to it some months she does fine she has some menstrual discomfort which she describes as pain there "in front".  .  She also describes low back pain and she is not sure why it comes.  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  I have asked her to help sort out the pain by keeping a pain calendar [facility] was given trigger point injections on her low back for myofascial pain. She bought OTC, she believes it is Midol PM but is unsure, to help with the pain.  She says it helps quite a lot but she "do not want to be taken a pill all the time".  She has no back pain during period. The pain doesn't radiate down her legs. 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 While medicine helped with the pain, but causes her to oversleep.  She doesn't want to have a hysterectomy but doesn't want to keep taking the pills. She says she keeps a pain log. She still has her periods, hasn't had one this month. LMP end of September. She has heavy bleeding for 2 days then spotting in between. She handle 2 days of bleeding but can't handle 7 days of heavy bleeding where she has to miss work.   ROS:  ROS +menorrhagia +chronic low back pain  Pertinent History Reviewed:   Reviewed: Significant for  Medical         Past Medical History:  Diagnosis Date   Bronchitis    Bronchitis     Eczema    Iron deficiency anemia    Menometrorrhagia                               Surgical Hx:    Past Surgical History:  Procedure Laterality Date   DILITATION & CURRETTAGE/HYSTROSCOPY WITH NOVASURE ABLATION N/A 12/06/2017   Procedure: DILATATION & CURETTAGE/HYSTEROSCOPY WITH MINERVA ENDOMETRIAL ABLATION;  Surgeon: Florian Buff, MD;  Location: [facility];  Service: Gynecology;  Laterality: N/A;   rods in left leg     TUBAL LIGATION  1997   Medications: Reviewed & Updated - see associated section                       Current Outpatient Medications:    aspirin-acetaminophen-caffeine (EXCEDRIN MIGRAINE) 250-250-65 MG tablet, Take 2 tablets by mouth daily as needed for headache., Disp: , Rfl:    cyclobenzaprine (FLEXERIL) 10 MG tablet, Take 1 tablet (10 mg total) by mouth every 8 (eight) hours as needed for muscle spasms. (Patient not taking: Reported on 09/25/2019), Disp: 30 tablet, Rfl: 1   ibuprofen (ADVIL,MOTRIN) 800 MG tablet, Take  1 tablet (800 mg total) by mouth every 8 (eight) hours as needed. (Patient not taking: Reported on 09/25/2019), Disp: 21 tablet, Rfl: 0   ondansetron (ZOFRAN ODT) 4 MG disintegrating tablet, Take 1 tablet (4 mg total) by mouth every 8 (eight) hours as needed for nausea or vomiting. (Patient not taking: Reported on 10/24/2018), Disp: 20 tablet, Rfl: 0   Social History: Reviewed -  reports that she has never smoked. She has never used smokeless tobacco.  Objective Findings:  Vitals: Blood pressure 115/78, pulse 64, height 5\' 4"  (1.626 m), weight 190 lb 3.2 oz (86.3 kg).  PHYSICAL EXAMINATION General appearance - alert, well appearing, and in no distress Mental status - alert, oriented to person, place, and time, normal mood, behavior, speech, Vargas, motor activity, and thought processes   PELVIC External genitalia - normal in appearance Vagina - normal secretions nontender good pelvic support Cervix - multiparous, normal secretions Uterus -  anterior, anti-flexed, mobile non-tender  Assessment & Plan:   A:  1. Chronic lower back pain, with spasm, no sciatica previously thought to be myofascial pain by Dr. 2. Normal pelvic exam  P:  1. Pain calendar x1 month patient asked to log location of pain severity of pain what helps what does not help and whether she is on her menses as well as Where slept, how long slept, back pain, and period durations    By signing my name below, I, Despina Hidden, attest that this documentation has been prepared under the direction and in the presence of Arnette Norris, MD. Electronically Signed: Tilda Burrow Medical Scribe. 09/25/19. 9:53 AM.  I personally performed the services described in this documentation, which was SCRIBED in my presence. The recorded information has been reviewed and considered accurate. It has been edited as necessary during review. 09/27/19, MD

## 2019-10-30 ENCOUNTER — Ambulatory Visit: Payer: BLUE CROSS/BLUE SHIELD | Admitting: Obstetrics and Gynecology

## 2019-12-16 ENCOUNTER — Telehealth: Payer: Self-pay | Admitting: *Deleted

## 2019-12-16 NOTE — Telephone Encounter (Signed)
Pt left message that she would like for Dr. Despina Hidden to send in the medicine to stop her period again. (megestrol sent in last in 2018)

## 2019-12-17 ENCOUNTER — Telehealth: Payer: Self-pay | Admitting: Obstetrics & Gynecology

## 2019-12-17 MED ORDER — MEGESTROL ACETATE 40 MG PO TABS: ORAL_TABLET | ORAL | 3 refills | Status: DC

## 2020-03-03 ENCOUNTER — Other Ambulatory Visit: Payer: Self-pay

## 2020-03-03 ENCOUNTER — Emergency Department (HOSPITAL_COMMUNITY): Payer: Self-pay

## 2020-03-03 ENCOUNTER — Encounter (HOSPITAL_COMMUNITY): Payer: Self-pay

## 2020-03-03 ENCOUNTER — Emergency Department (HOSPITAL_COMMUNITY)
Admission: EM | Admit: 2020-03-03 | Discharge: 2020-03-03 | Disposition: A | Discharge status: Home / Self Care | Payer: Self-pay | Attending: Emergency Medicine | Admitting: Emergency Medicine

## 2020-03-03 DIAGNOSIS — Y999 Unspecified external cause status: Secondary | ICD-10-CM | POA: Insufficient documentation

## 2020-03-03 DIAGNOSIS — S92525A Nondisplaced fracture of medial phalanx of left lesser toe(s), initial encounter for closed fracture: Secondary | ICD-10-CM | POA: Insufficient documentation

## 2020-03-03 DIAGNOSIS — W2203XA Walked into furniture, initial encounter: Secondary | ICD-10-CM | POA: Insufficient documentation

## 2020-03-03 DIAGNOSIS — Y9301 Activity, walking, marching and hiking: Secondary | ICD-10-CM | POA: Insufficient documentation

## 2020-03-03 DIAGNOSIS — Y929 Unspecified place or not applicable: Secondary | ICD-10-CM | POA: Insufficient documentation

## 2020-03-03 NOTE — ED Provider Notes (Signed)
[facility] Emergency Department Provider Note MRN:  [id number]  Arrival date & time: 03/03/20     Chief Complaint   Toe Pain   History of Present Illness   Shelley Vargas is a 44 y.o. year-old female with no pertinent past medical history presenting to the ED with chief complaint of toe pain.  Patient was walking around in the dark a few days ago and stubbed her toe on a piece of furniture.  Pain to the left second toe.  Since then has been noticing pain in her ankle as well.  Prior fracture to the ankle with screws in place.  Denies fever, no other complaints.  Review of Systems  A complete 10 system review of systems was obtained and all systems are negative except as noted in the HPI and PMH.   Patient's Health History    Past Medical History:  Diagnosis Date  . Bronchitis   . Bronchitis   . Eczema   . Iron deficiency anemia   . Menometrorrhagia     Past Surgical History:  Procedure Laterality Date  . DILITATION & CURRETTAGE/HYSTROSCOPY WITH NOVASURE ABLATION N/A 12/06/2017   Procedure: DILATATION & CURETTAGE/HYSTEROSCOPY WITH MINERVA ENDOMETRIAL ABLATION;  Surgeon: Florian Buff, MD;  Location: [facility];  Service: Gynecology;  Laterality: N/A;  . rods in left leg    . TUBAL LIGATION  1997    Family History  Problem Relation Age of Onset  . Hypertension Mother   . Hypertension Father     Social History   Socioeconomic History  . Marital status: Married    Spouse name: Not on file  . Number of children: Not on file  . Years of education: Not on file  . Highest education level: Not on file  Occupational History  . Not on file  Tobacco Use  . Smoking status: Never Smoker  . Smokeless tobacco: Never Used  Substance and Sexual Activity  . Alcohol use: No    Alcohol/week: 1.0 standard drinks    Types: 1 Glasses of wine per week  . Drug use: No  . Sexual activity: Yes    Birth control/protection: None, Surgical    Comment: tubal and  ablation  Other Topics Concern  . Not on file  Social History Narrative  . Not on file   Social Determinants of Health   Financial Resource Strain:   . Difficulty of Paying Living Expenses:   Food Insecurity:   . Worried About Charity fundraiser in the Last Year:   . Arboriculturist in the Last Year:   Transportation Needs:   . Film/video editor (Medical):   Marland Kitchen Lack of Transportation (Non-Medical):   Physical Activity:   . Days of Exercise per Week:   . Minutes of Exercise per Session:   Stress:   . Feeling of Stress :   Social Connections:   . Frequency of Communication with Friends and Family:   . Frequency of Social Gatherings with Friends and Family:   . Attends Religious Services:   . Active Member of Clubs or Organizations:   . Attends Archivist Meetings:   Marland Kitchen Marital Status:   Intimate Partner Violence:   . Fear of Current or Ex-Partner:   . Emotionally Abused:   Marland Kitchen Physically Abused:   . Sexually Abused:      Physical Exam  There were no vitals filed for this visit.  CONSTITUTIONAL: Well-appearing, NAD NEURO:  Alert and oriented x  3, no focal deficits EYES:  eyes equal and reactive ENT/NECK:  no LAD, no JVD CARDIO: Regular rate, well-perfused, normal S1 and S2 PULM:  CTAB no wheezing or rhonchi GI/GU:  normal bowel sounds, non-distended, non-tender MSK/SPINE:  No gross deformities, no edema, normal range of motion of the left knee, ankle, foot, toes, neurovascularly intact distally SKIN: Mild bruising to the dorsal aspect of the left second toe PSYCH:  Appropriate speech and behavior  *Additional and/or pertinent findings included in MDM below  Diagnostic and Interventional Summary    EKG Interpretation  Date/Time:    Ventricular Rate:    PR Interval:    QRS Duration:   QT Interval:    QTC Calculation:   R Axis:     Text Interpretation:        Labs Reviewed - No data to display  DG Ankle Complete Left  Final Result    DG Foot  Complete Left  Final Result      Medications - No data to display   Procedures  /  Critical Care Procedures  ED Course and Medical Decision Making  I have reviewed the triage vital signs, the nursing notes, and pertinent available records from the EMR.  Pertinent labs & imaging results that were available during my care of the patient were reviewed by me and considered in my medical decision making (see below for details).     X-rays to exclude fracture or hardware complication, suspect sprain or strain.  No hardware complication, x-ray reveals broken toe, appropriate for buddy tape and discharge.  Elmer Sow. Pilar Plate, MD [facility] Emergency Medicine [facility] [email or web address]  Final Clinical Impressions(s) / ED Diagnoses     ICD-10-CM   1. Closed nondisplaced fracture of middle phalanx of lesser toe of left foot, initial encounter  P53.614E     ED Discharge Orders    None       Discharge Instructions Discussed with and Provided to Patient:     Discharge Instructions     You were evaluated in the Emergency Department and after careful evaluation, we did not find any emergent condition requiring admission or further testing in the hospital.  Your exam/testing today was overall reassuring.  Your x-ray showed a broken toe.  The broken toe should heal well, especially if you tape it to the adjacent toe for support for the next few weeks.  We recommend Tylenol or Motrin at home for discomfort.  Please return to the Emergency Department if you experience any worsening of your condition.  We encourage you to follow up with a primary care provider.  Thank you for allowing [place] to be a part of your care.        Sabas Sous, MD 03/03/20 [id number]

## 2020-03-03 NOTE — ED Triage Notes (Signed)
Pt presents to ED with complaints of left 2nd toe pain after hitting on table couple days ago.

## 2020-03-03 NOTE — Discharge Instructions (Addendum)
You were evaluated in the Emergency Department and after careful evaluation, we did not find any emergent condition requiring admission or further testing in the hospital.  Your exam/testing today was overall reassuring.  Your x-ray showed a broken toe.  The broken toe should heal well, especially if you tape it to the adjacent toe for support for the next few weeks.  We recommend Tylenol or Motrin at home for discomfort.  Please return to the Emergency Department if you experience any worsening of your condition.  We encourage you to follow up with a primary care provider.  Thank you for allowing [place] to be a part of your care.

## 2020-04-07 ENCOUNTER — Other Ambulatory Visit: Payer: Self-pay

## 2020-04-07 ENCOUNTER — Ambulatory Visit (INDEPENDENT_AMBULATORY_CARE_PROVIDER_SITE_OTHER): Payer: Self-pay | Admitting: Family Medicine

## 2020-04-07 ENCOUNTER — Encounter: Payer: Self-pay | Admitting: Family Medicine

## 2020-04-07 VITALS — BP 136/80 | HR 66 | Temp 97.9°F | Resp 14 | Ht 64.0 in | Wt 192.0 lb

## 2020-04-07 DIAGNOSIS — Z124 Encounter for screening for malignant neoplasm of cervix: Secondary | ICD-10-CM

## 2020-04-07 DIAGNOSIS — N921 Excessive and frequent menstruation with irregular cycle: Secondary | ICD-10-CM

## 2020-04-07 DIAGNOSIS — Z0000 Encounter for general adult medical examination without abnormal findings: Secondary | ICD-10-CM

## 2020-04-07 DIAGNOSIS — B379 Candidiasis, unspecified: Secondary | ICD-10-CM

## 2020-04-07 DIAGNOSIS — Z0001 Encounter for general adult medical examination with abnormal findings: Secondary | ICD-10-CM

## 2020-04-07 DIAGNOSIS — G8929 Other chronic pain: Secondary | ICD-10-CM

## 2020-04-07 DIAGNOSIS — E669 Obesity, unspecified: Secondary | ICD-10-CM

## 2020-04-07 DIAGNOSIS — Z113 Encounter for screening for infections with a predominantly sexual mode of transmission: Secondary | ICD-10-CM

## 2020-04-07 DIAGNOSIS — M545 Low back pain: Secondary | ICD-10-CM

## 2020-04-07 DIAGNOSIS — Z1231 Encounter for screening mammogram for malignant neoplasm of breast: Secondary | ICD-10-CM

## 2020-04-07 LAB — URINALYSIS, ROUTINE W REFLEX MICROSCOPIC
Bilirubin Urine: NEGATIVE
Glucose, UA: NEGATIVE
Hyaline Cast: NONE SEEN /LPF
Ketones, ur: NEGATIVE
Leukocytes,Ua: NEGATIVE
Nitrite: NEGATIVE
Protein, ur: NEGATIVE
Specific Gravity, Urine: 1.025 (ref 1.001–1.03)
WBC, UA: NONE SEEN /HPF (ref 0–5)
pH: 5.5 (ref 5.0–8.0)

## 2020-04-07 LAB — WET PREP FOR TRICH, YEAST, CLUE

## 2020-04-07 LAB — MICROSCOPIC MESSAGE

## 2020-04-07 MED ORDER — MEGESTROL ACETATE 40 MG PO TABS: ORAL_TABLET | ORAL | 0 refills | Status: DC

## 2020-04-07 MED ORDER — FLUCONAZOLE 150 MG PO TABS
ORAL_TABLET | ORAL | 0 refills | Status: DC
Start: 2020-04-07 — End: 2021-01-01

## 2020-04-07 NOTE — Assessment & Plan Note (Signed)
Discussed physical activity Healthy eating  check lipids, fasting glucose

## 2020-04-07 NOTE — Patient Instructions (Signed)
Call GYN if the bleeding does not stop F/U 1 year for Physical

## 2020-04-07 NOTE — Assessment & Plan Note (Signed)
CPE done, PAP Smear obtained, Fasting labs, pt to schedule mammogram

## 2020-04-07 NOTE — Progress Notes (Signed)
Subjective:    Patient ID: Shelley Vargas, female    DOB: 02-18-1976, 44 y.o.   MRN: [id number]  Patient presents for Gynecologic Exam (is fasting) and Dysuria   Pt here for CPE   Seen in ER 03/03/20 she hit her toe on a piece of furniture, had non dis-placed lesser toe fracture that has been healing    LMP started last week still has spotting- had light cycle but had back pain with it, periord before last week was Oct 2020 , she request refill on megace, this is whatshe takes when cycles occur   hadmild discharge no itching or burning sensation  Back pain, left UA to check, but no UTI symptoms Typically only gets back pain with cycles      she has history of Hematrometra s/p ablation  had cervical dilation with removal in  2019 by [facility]     Immunizations- COVID 59 done - Moderna / TDAP/FLU UTD    Due for mammogram    [place] [facility] 02/07/20   4 /12/41 [id number]   Review Of Systems:  GEN- denies fatigue, fever, weight loss,weakness, recent illness HEENT- denies eye drainage, change in vision, nasal discharge, CVS- denies chest pain, palpitations RESP- denies SOB, cough, wheeze ABD- denies N/V, change in stools, abd pain GU- denies dysuria, hematuria, dribbling, incontinence MSK- + joint pain, denies muscle aches, injury Neuro- denies headache, dizziness, syncope, seizure activity       Objective:    BP 136/80   Pulse 66   Temp 97.9 F (36.6 C) (Temporal)   Resp 14   Ht 5\' 4"  (1.626 m)   Wt 192 lb (87.1 kg)   SpO2 98%   BMI 32.96 kg/m  GEN- NAD, alert and oriented x3 HEENT- PERRL, EOMI, non injected sclera, pink conjunctiva, MMM, oropharynx clear Neck- supple, no thyromegaly Breast- normal symmetry, no nipple inversion,no nipple drainage, no nodules or lumps felt Nodes- no axillary nodes CVS- RRR, no murmur RESP-CTAB ABD-NABS,soft,NT,ND, no CVA tenderness  MSK- spine NT, FROM GU- normal external genitalia, vaginal mucosa  pink and moist, cervix visualized no growth, + blood form os, + discharge, no CMT, no ovarian masses, uterus normal size EXT- No edema Pulses- Radial, DP- 2+        Assessment & Plan:      Problem List Items Addressed This Visit      Unprioritized   Class 1 obesity    Discussed physical activity Healthy eating  check lipids, fasting glucose      Routine general medical examination at a health care facility - Primary    CPE done, PAP Smear obtained, Fasting labs, pt to schedule mammogram      Relevant Orders   CBC with Differential/Platelet   Comprehensive metabolic panel   Lipid panel   TSH    Other Visit Diagnoses    Chronic midline low back pain without sciatica       Back pain associated with menses, has been seen by GYN for this multiple times, benign back exam   Relevant Orders   Urinalysis, Routine w reflex microscopic (Completed)   Cervical cancer screening       Relevant Orders   Pap IG w/ reflex to HPV when ASC-U   Screen for STD (sexually transmitted disease)       Relevant Orders   WET PREP FOR [place], YEAST, CLUE (Completed)   C. trachomatis/N. gonorrhoeae RNA   HIV Antibody (routine testing w  rflx)   Encounter for screening mammogram for malignant neoplasm of breast       Relevant Orders   MM 3D SCREEN BREAST BILATERAL   Yeast infection       noted on wetprep, treat with diflucan    Relevant Medications   fluconazole (DIFLUCAN) 150 MG tablet   Menorrhagia with irregular cycle       s/p ablation refilled megace, she does not want hysterectomy, advised if bleeding does not stop needs to call GYN      Note: This dictation was prepared with Dragon dictation along with smaller phrase technology. Any transcriptional errors that result from this process are unintentional.

## 2020-04-08 LAB — CBC WITH DIFFERENTIAL/PLATELET
Absolute Monocytes: 271 cells/uL (ref 200–950)
Basophils Absolute: 30 cells/uL (ref 0–200)
Basophils Relative: 0.7 %
Eosinophils Absolute: 52 cells/uL (ref 15–500)
Eosinophils Relative: 1.2 %
HCT: 41 % (ref 35.0–45.0)
Hemoglobin: 13.3 g/dL (ref 11.7–15.5)
Lymphs Abs: 1475 cells/uL (ref 850–3900)
MCH: 28.4 pg (ref 27.0–33.0)
MCHC: 32.4 g/dL (ref 32.0–36.0)
MCV: 87.4 fL (ref 80.0–100.0)
MPV: 11.3 fL (ref 7.5–12.5)
Monocytes Relative: 6.3 %
Neutro Abs: 2473 cells/uL (ref 1500–7800)
Neutrophils Relative %: 57.5 %
Platelets: 240 10*3/uL (ref 140–400)
RBC: 4.69 10*6/uL (ref 3.80–5.10)
RDW: 11.8 % (ref 11.0–15.0)
Total Lymphocyte: 34.3 %
WBC: 4.3 10*3/uL (ref 3.8–10.8)

## 2020-04-08 LAB — COMPREHENSIVE METABOLIC PANEL
AG Ratio: 1.3 (calc) (ref 1.0–2.5)
ALT: 16 U/L (ref 6–29)
AST: 14 U/L (ref 10–35)
Albumin: 4.1 g/dL (ref 3.6–5.1)
Alkaline phosphatase (APISO): 76 U/L (ref 31–125)
BUN: 11 mg/dL (ref 7–25)
CO2: 25 mmol/L (ref 20–32)
Calcium: 9.1 mg/dL (ref 8.6–10.2)
Chloride: 105 mmol/L (ref 98–110)
Creat: 0.77 mg/dL (ref 0.50–1.10)
Globulin: 3.1 g/dL (calc) (ref 1.9–3.7)
Glucose, Bld: 74 mg/dL (ref 65–99)
Potassium: 4.4 mmol/L (ref 3.5–5.3)
Sodium: 139 mmol/L (ref 135–146)
Total Bilirubin: 0.3 mg/dL (ref 0.2–1.2)
Total Protein: 7.2 g/dL (ref 6.1–8.1)

## 2020-04-08 LAB — C. TRACHOMATIS/N. GONORRHOEAE RNA
C. trachomatis RNA, TMA: NOT DETECTED
N. gonorrhoeae RNA, TMA: NOT DETECTED

## 2020-04-08 LAB — LIPID PANEL
Cholesterol: 208 mg/dL — ABNORMAL HIGH (ref <200)
HDL: 57 mg/dL (ref >=50)
LDL Cholesterol (Calc): 133 mg/dL (calc) — ABNORMAL HIGH
Non-HDL Cholesterol (Calc): 151 mg/dL (calc) — ABNORMAL HIGH (ref <130)
Total CHOL/HDL Ratio: 3.6 (calc) (ref <5.0)
Triglycerides: 83 mg/dL (ref <150)

## 2020-04-08 LAB — TSH: TSH: 1.54 mIU/L

## 2020-04-08 LAB — HIV ANTIBODY (ROUTINE TESTING W REFLEX): HIV 1&2 Ab, 4th Generation: NONREACTIVE

## 2020-04-09 LAB — PAP IG W/ RFLX HPV ASCU

## 2020-04-16 ENCOUNTER — Telehealth: Payer: Self-pay | Admitting: Family Medicine

## 2020-04-16 NOTE — Telephone Encounter (Signed)
Call placed to patient to inquire. No answer. No VM.   

## 2020-04-16 NOTE — Telephone Encounter (Signed)
CB# [phone] Having some congestion /sunis need medication  If possible sent to the  pharmacy if possible

## 2020-04-17 NOTE — Telephone Encounter (Signed)
Call placed to patient.   Reports that she has x3 days of head and nasal congestion and productive cough with clear mucus.   Advised to use OTC Mucinex or Robitussin for cough and nasal saline for congestion. Advised to increase rest and to increase fluid intake. Recommended that if symptoms worsen or persist >1 week to contact office for visit.

## 2020-05-01 ENCOUNTER — Ambulatory Visit (HOSPITAL_COMMUNITY)
Admission: RE | Admit: 2020-05-01 | Discharge: 2020-05-01 | Disposition: A | Discharge status: Home / Self Care | Payer: Self-pay | Source: Ambulatory Visit | Attending: Family Medicine | Admitting: Family Medicine

## 2020-05-01 ENCOUNTER — Other Ambulatory Visit: Payer: Self-pay

## 2020-05-01 DIAGNOSIS — Z1231 Encounter for screening mammogram for malignant neoplasm of breast: Secondary | ICD-10-CM | POA: Insufficient documentation

## 2020-05-13 ENCOUNTER — Telehealth: Payer: Self-pay | Admitting: Obstetrics and Gynecology

## 2020-05-13 NOTE — Telephone Encounter (Signed)
Pt walked in the office requesting medicine to be sent to her pharmacy that dr Emelda Fear has previously prescribed to help stop bleeding patient states medication is megace. Looking at her medication list another provider prescribed this and pt states that's incorrect dr Emelda Fear prescribed it. Advised patient we will have nurse follow up with her.

## 2020-05-25 ENCOUNTER — Ambulatory Visit: Payer: Self-pay | Admitting: Obstetrics & Gynecology

## 2020-05-26 ENCOUNTER — Ambulatory Visit: Payer: Self-pay | Admitting: Obstetrics & Gynecology

## 2020-08-10 ENCOUNTER — Other Ambulatory Visit: Payer: Self-pay

## 2020-08-10 DIAGNOSIS — Z20822 Contact with and (suspected) exposure to covid-19: Secondary | ICD-10-CM

## 2020-08-12 LAB — SARS-COV-2, NAA 2 DAY TAT

## 2020-08-12 LAB — NOVEL CORONAVIRUS, NAA: SARS-CoV-2, NAA: NOT DETECTED

## 2020-11-30 ENCOUNTER — Telehealth: Payer: Self-pay | Admitting: Adult Health

## 2020-11-30 ENCOUNTER — Other Ambulatory Visit: Payer: Self-pay

## 2020-11-30 DIAGNOSIS — Z20822 Contact with and (suspected) exposure to covid-19: Secondary | ICD-10-CM

## 2020-11-30 NOTE — Telephone Encounter (Signed)
Pt called, upset that the CDC has recommendation in her chart for the Hep C testing being overdue She's upset because it's recommended for those born 1945-1965 & she was born [birth year] Would like it removed from her chart because she don't have Hep C Very upset that the CDC has any recommendations in her chart  Told pt we would see what could be done about getting this removed from her chart

## 2020-12-01 LAB — NOVEL CORONAVIRUS, NAA: SARS-CoV-2, NAA: NOT DETECTED

## 2020-12-01 LAB — SARS-COV-2, NAA 2 DAY TAT

## 2020-12-04 ENCOUNTER — Other Ambulatory Visit: Payer: Self-pay

## 2020-12-04 DIAGNOSIS — Z20822 Contact with and (suspected) exposure to covid-19: Secondary | ICD-10-CM

## 2020-12-07 LAB — SPECIMEN STATUS REPORT

## 2020-12-07 LAB — NOVEL CORONAVIRUS, NAA: SARS-CoV-2, NAA: NOT DETECTED

## 2021-01-01 ENCOUNTER — Ambulatory Visit (INDEPENDENT_AMBULATORY_CARE_PROVIDER_SITE_OTHER): Payer: 59 | Admitting: Family Medicine

## 2021-01-01 ENCOUNTER — Encounter: Payer: Self-pay | Admitting: Family Medicine

## 2021-01-01 ENCOUNTER — Other Ambulatory Visit: Payer: Self-pay

## 2021-01-01 VITALS — BP 128/78 | HR 70 | Temp 98.3°F | Resp 14 | Ht 64.0 in | Wt 208.0 lb

## 2021-01-01 DIAGNOSIS — N898 Other specified noninflammatory disorders of vagina: Secondary | ICD-10-CM | POA: Diagnosis not present

## 2021-01-01 DIAGNOSIS — Z23 Encounter for immunization: Secondary | ICD-10-CM | POA: Diagnosis not present

## 2021-01-01 DIAGNOSIS — D509 Iron deficiency anemia, unspecified: Secondary | ICD-10-CM | POA: Diagnosis not present

## 2021-01-01 DIAGNOSIS — E669 Obesity, unspecified: Secondary | ICD-10-CM | POA: Diagnosis not present

## 2021-01-01 DIAGNOSIS — R8290 Unspecified abnormal findings in urine: Secondary | ICD-10-CM | POA: Diagnosis not present

## 2021-01-01 DIAGNOSIS — Z113 Encounter for screening for infections with a predominantly sexual mode of transmission: Secondary | ICD-10-CM

## 2021-01-01 LAB — WET PREP FOR TRICH, YEAST, CLUE

## 2021-01-01 NOTE — Assessment & Plan Note (Signed)
Recheck iron stores and CBC, no longer on supplement

## 2021-01-01 NOTE — Assessment & Plan Note (Signed)
Dietary changes 

## 2021-01-01 NOTE — Patient Instructions (Signed)
Flu shot given We will call with lab results

## 2021-01-01 NOTE — Progress Notes (Signed)
   Subjective:    Patient ID: Tilman Neat, female    DOB: 1976/10/28, 45 y.o.   MRN: [id number]  Patient presents for Gynecologic Exam (Is not fasting)  PAP Smear UTD with her GYN  Immunizations- COVID UTD, TDAP UTD   Mammogram UTD She has a strong odor to her urine would like to be checked for UTI.  No pelvic pain or pressure no abdominal pain.    She requests STD screening today.  Iron def previous DUB improved, no longer on megace, needs CBC/iron checked   Review Of Systems:  GEN- denies fatigue, fever, weight loss,weakness, recent illness HEENT- denies eye drainage, change in vision, nasal discharge, CVS- denies chest pain, palpitations RESP- denies SOB, cough, wheeze ABD- denies N/V, change in stools, abd pain GU- denies dysuria, hematuria, dribbling, incontinence MSK- denies joint pain, muscle aches, injury Neuro- denies headache, dizziness, syncope, seizure activity       Objective:    BP 128/78   Pulse 70   Temp 98.3 F (36.8 C) (Temporal)   Resp 14   Ht 5\' 4"  (1.626 m)   Wt 208 lb (94.3 kg)   SpO2 97%   BMI 35.70 kg/m  GEN- NAD, alert and oriented x3 HEENT- PERRL, EOMI, non injected sclera, pink conjunctiva,  CVS- RRR, no murmur RESP-CTAB ABD-NABS,soft,NT,ND GU- normal external genitalia, vaginal mucosa pink and moist, cervix visualized no growth, no blood form os, +r discharge, no CMT, no ovarian masses, uterus normal size EXT- No edema Pulses- Radial 2+        Assessment & Plan:      Problem List Items Addressed This Visit      Unprioritized   Class 1 obesity    Dietary changes      Relevant Orders   Comprehensive metabolic panel   CBC with Differential/Platelet   Iron deficiency anemia    Recheck iron stores and CBC, no longer on supplement       Relevant Orders   Comprehensive metabolic panel   CBC with Differential/Platelet   Iron, TIBC and Ferritin Panel    Other Visit Diagnoses    Vaginal discharge    -  Primary   wet  prep and GC done, STD screen, UA for odorous urine   Relevant Orders   WET PREP FOR TRICH, YEAST, CLUE (Completed)   C. trachomatis/N. gonorrhoeae RNA   Malodorous urine       Screen for STD (sexually transmitted disease)       Relevant Orders   HIV Antibody (routine testing w rflx)   RPR   Urinalysis, Routine w reflex microscopic   Need for immunization against influenza       Relevant Orders   Flu Vaccine QUAD 36+ mos IM (Completed)      Note: This dictation was prepared with Dragon dictation along with smaller phrase technology. Any transcriptional errors that result from this process are unintentional.

## 2021-01-02 LAB — URINALYSIS, ROUTINE W REFLEX MICROSCOPIC
Bilirubin Urine: NEGATIVE
Glucose, UA: NEGATIVE
Hgb urine dipstick: NEGATIVE
Ketones, ur: NEGATIVE
Leukocytes,Ua: NEGATIVE
Nitrite: NEGATIVE
Protein, ur: NEGATIVE
Specific Gravity, Urine: 1.029 (ref 1.001–1.03)
pH: 6 (ref 5.0–8.0)

## 2021-01-04 ENCOUNTER — Telehealth: Payer: Self-pay | Admitting: Family Medicine

## 2021-01-04 ENCOUNTER — Other Ambulatory Visit: Payer: Self-pay | Admitting: *Deleted

## 2021-01-04 LAB — CBC WITH DIFFERENTIAL/PLATELET
Absolute Monocytes: 466 cells/uL (ref 200–950)
Basophils Absolute: 30 cells/uL (ref 0–200)
Basophils Relative: 0.5 %
Eosinophils Absolute: 59 cells/uL (ref 15–500)
Eosinophils Relative: 1 %
HCT: 38.7 % (ref 35.0–45.0)
Hemoglobin: 12.6 g/dL (ref 11.7–15.5)
Lymphs Abs: 1546 cells/uL (ref 850–3900)
MCH: 28.4 pg (ref 27.0–33.0)
MCHC: 32.6 g/dL (ref 32.0–36.0)
MCV: 87.4 fL (ref 80.0–100.0)
MPV: 10.9 fL (ref 7.5–12.5)
Monocytes Relative: 7.9 %
Neutro Abs: 3800 cells/uL (ref 1500–7800)
Neutrophils Relative %: 64.4 %
Platelets: 255 10*3/uL (ref 140–400)
RBC: 4.43 10*6/uL (ref 3.80–5.10)
RDW: 11.4 % (ref 11.0–15.0)
Total Lymphocyte: 26.2 %
WBC: 5.9 10*3/uL (ref 3.8–10.8)

## 2021-01-04 LAB — IRON,TIBC AND FERRITIN PANEL
%SAT: 26 % (calc) (ref 16–45)
Ferritin: 37 ng/mL (ref 16–232)
Iron: 71 ug/dL (ref 40–190)
TIBC: 274 mcg/dL (calc) (ref 250–450)

## 2021-01-04 LAB — COMPREHENSIVE METABOLIC PANEL
AG Ratio: 1.5 (calc) (ref 1.0–2.5)
ALT: 19 U/L (ref 6–29)
AST: 14 U/L (ref 10–35)
Albumin: 4 g/dL (ref 3.6–5.1)
Alkaline phosphatase (APISO): 70 U/L (ref 31–125)
BUN: 11 mg/dL (ref 7–25)
CO2: 28 mmol/L (ref 20–32)
Calcium: 8.8 mg/dL (ref 8.6–10.2)
Chloride: 104 mmol/L (ref 98–110)
Creat: 0.73 mg/dL (ref 0.50–1.10)
Globulin: 2.7 g/dL (calc) (ref 1.9–3.7)
Glucose, Bld: 57 mg/dL — ABNORMAL LOW (ref 65–99)
Potassium: 3.7 mmol/L (ref 3.5–5.3)
Sodium: 140 mmol/L (ref 135–146)
Total Bilirubin: 0.3 mg/dL (ref 0.2–1.2)
Total Protein: 6.7 g/dL (ref 6.1–8.1)

## 2021-01-04 LAB — C. TRACHOMATIS/N. GONORRHOEAE RNA
C. trachomatis RNA, TMA: NOT DETECTED
N. gonorrhoeae RNA, TMA: NOT DETECTED

## 2021-01-04 LAB — HIV ANTIBODY (ROUTINE TESTING W REFLEX): HIV 1&2 Ab, 4th Generation: NONREACTIVE

## 2021-01-04 LAB — RPR: RPR Ser Ql: NONREACTIVE

## 2021-01-04 MED ORDER — METRONIDAZOLE 500 MG PO TABS: 500.0000 mg | ORAL_TABLET | Freq: Two times a day (BID) | ORAL | 0 refills | Status: DC

## 2021-01-04 NOTE — Telephone Encounter (Signed)
Pt called wanting to know about her results of her lab work and about her blood pressure.  Cb# [id number]

## 2021-01-04 NOTE — Telephone Encounter (Signed)
Labs have not been reviewed by PCP at this time.   Will contact with PCP recommendations.

## 2021-01-04 NOTE — Telephone Encounter (Signed)
Pt is calling to get results of lab testing and questioning the low blood sugar reading. Pt was fasting during labs

## 2021-03-25 ENCOUNTER — Emergency Department
Admission: EM | Admit: 2021-03-25 | Discharge: 2021-03-25 | Disposition: A | Discharge status: Home / Self Care | Payer: 59 | Attending: Emergency Medicine | Admitting: Emergency Medicine

## 2021-03-25 ENCOUNTER — Emergency Department: Payer: 59

## 2021-03-25 ENCOUNTER — Other Ambulatory Visit: Payer: Self-pay

## 2021-03-25 DIAGNOSIS — J101 Influenza due to other identified influenza virus with other respiratory manifestations: Secondary | ICD-10-CM | POA: Insufficient documentation

## 2021-03-25 DIAGNOSIS — Z20822 Contact with and (suspected) exposure to covid-19: Secondary | ICD-10-CM | POA: Diagnosis not present

## 2021-03-25 DIAGNOSIS — R059 Cough, unspecified: Secondary | ICD-10-CM | POA: Diagnosis present

## 2021-03-25 LAB — RESP PANEL BY RT-PCR (FLU A&B, COVID) ARPGX2
Influenza A by PCR: POSITIVE — AB
Influenza B by PCR: NEGATIVE
SARS Coronavirus 2 by RT PCR: NEGATIVE

## 2021-03-25 MED ORDER — GUAIFENESIN-CODEINE 100-10 MG/5ML PO SOLN: 5.0000 mL | Freq: Four times a day (QID) | ORAL | 0 refills | Status: DC | PRN

## 2021-03-25 MED ORDER — ACETAMINOPHEN 500 MG PO TABS
1000.0000 mg | ORAL_TABLET | Freq: Once | ORAL | Status: AC
  Administered 2021-03-25: 1000 mg via ORAL
  Filled 2021-03-25: qty 2

## 2021-03-25 NOTE — ED Notes (Signed)
See triage note  Presents with cough since Monday  Low grade temp this am and body aches and cough

## 2021-03-25 NOTE — Discharge Instructions (Signed)
Follow-up with your primary care provider if any continued problems or concerns.  Increase fluids.  Tylenol or ibuprofen as needed for fever or body aches.  A prescription for cough medication was sent to your pharmacy.  Rest.  Influenza is contagious and you may want to let other people that you have been exposed to since she began having symptoms know that you do have influenza

## 2021-03-25 NOTE — ED Provider Notes (Signed)
[facility] Emergency Department Provider Note  ____________________________________________   Event Date/Time   First MD Initiated Contact with Patient 03/25/21 1108     (approximate)  I have reviewed the triage vital signs and the nursing notes.   HISTORY  Chief Complaint Cough and Generalized Body Aches   HPI Shelley Vargas is a 45 y.o. female presents to the ED with complaint onset body aches, cough, headache for the last 3 to 4 days.  Patient is unaware of any fever but reports some chills.  Patient is unaware of any known exposure to COVID.  She has had all 3 COVID vaccines of Moderna.  No vomiting or diarrhea.  She rates her pain as 7 out of 10.         Past Medical History:  Diagnosis Date  . Bronchitis   . Bronchitis   . Eczema   . Iron deficiency anemia   . Menometrorrhagia     Patient Active Problem List   Diagnosis Date Noted  . Routine general medical examination at a health care facility 06/13/2014  . Keloid 01/07/2014  . Eczema 05/28/2013  . Hand dermatitis 08/28/2012  . Class 1 obesity 07/11/2011  . Iron deficiency anemia 07/11/2011    Past Surgical History:  Procedure Laterality Date  . DILITATION & CURRETTAGE/HYSTROSCOPY WITH NOVASURE ABLATION N/A 12/06/2017   Procedure: DILATATION & CURETTAGE/HYSTEROSCOPY WITH MINERVA ENDOMETRIAL ABLATION;  Surgeon: Lazaro Arms, MD;  Location: [facility];  Service: Gynecology;  Laterality: N/A;  . rods in left leg    . TUBAL LIGATION  1997    Prior to Admission medications   Medication Sig Start Date End Date Taking? Authorizing Provider  guaiFENesin-codeine 100-10 MG/5ML syrup Take 5 mLs by mouth every 6 (six) hours as needed. 03/25/21  Yes Tommi Rumps, PA-C    Allergies Penicillins  Family History  Problem Relation Age of Onset  . Hypertension Mother   . Hypertension Father     Social History Social History   Tobacco Use  . Smoking status: Never Smoker  .  Smokeless tobacco: Never Used  Vaping Use  . Vaping Use: Never used  Substance Use Topics  . Alcohol use: No    Alcohol/week: 1.0 standard drink    Types: 1 Glasses of wine per week  . Drug use: No    Review of Systems Constitutional: No fever/chills Eyes: No visual changes. ENT: No sore throat. Cardiovascular: Denies chest pain. Respiratory: Denies shortness of breath.  Positive for cough. Gastrointestinal: No abdominal pain.  No nausea, no vomiting.  No diarrhea.   Genitourinary: Negative for dysuria. Musculoskeletal: Positive for body aches. Skin: Negative for rash. Neurological: Positive for generalized headache.  Negative for focal weakness or numbness. ____________________________________________   PHYSICAL EXAM:  VITAL SIGNS: ED Triage Vitals  Enc Vitals Group     BP 03/25/21 1100 (!) 150/94     Pulse Rate 03/25/21 1100 93     Resp 03/25/21 1100 18     Temp 03/25/21 1100 99.2 F (37.3 C)     Temp Source 03/25/21 1100 Oral     SpO2 03/25/21 1100 95 %     Weight 03/25/21 1100 196 lb (88.9 kg)     Height 03/25/21 1100 5\' 4"  (1.626 m)     Head Circumference --      Peak Flow --      Pain Score 03/25/21 1102 7     Pain Loc --  Pain Edu? --      Excl. in GC? --     Constitutional: Alert and oriented. Well appearing and in no acute distress. Eyes: Conjunctivae are normal.  Head: Atraumatic. Nose: Mild congestion/rhinnorhea. Neck: No stridor.   Cardiovascular: Normal rate, regular rhythm. Grossly normal heart sounds.  Good peripheral circulation. Respiratory: Normal respiratory effort.  No retractions. Lungs CTAB. Gastrointestinal: Soft and nontender. No distention.  Bowel sounds normoactive x4 quadrants. Musculoskeletal: No lower extremity tenderness nor edema.  No joint effusions. Neurologic:  Normal speech and language. No gross focal neurologic deficits are appreciated. No gait instability. Skin:  Skin is warm, dry and intact. No rash  noted. Psychiatric: Mood and affect are normal. Speech and behavior are normal.  ____________________________________________   LABS (all labs ordered are listed, but only abnormal results are displayed)  Labs Reviewed  RESP PANEL BY RT-PCR (FLU A&B, COVID) ARPGX2 - Abnormal; Notable for the following components:      Result Value   Influenza A by PCR POSITIVE (*)    All other components within normal limits   ____________________________________________  RADIOLOGY Beaulah Corin, personally viewed and evaluated these images (plain radiographs) as part of my medical decision making, as well as reviewing the written report by the radiologist.   Official radiology report(s): DG Chest Port 1 View  Result Date: 03/25/2021 CLINICAL DATA:  Cough. EXAM: PORTABLE CHEST 1 VIEW COMPARISON:  No prior. FINDINGS: Cardiomegaly. No pulmonary venous congestion. No focal infiltrate. No pleural effusion or pneumothorax. Mild thoracic spine scoliosis. IMPRESSION: 1.  Cardiomegaly.  No pulmonary venous congestion. 2.  No focal infiltrate. Electronically Signed   By: Maisie Fus  Register   On: 03/25/2021 12:29    ____________________________________________   PROCEDURES  Procedure(s) performed (including Critical Care):  Procedures   ____________________________________________   INITIAL IMPRESSION / ASSESSMENT AND PLAN / ED COURSE  As part of my medical decision making, I reviewed the following data within the electronic MEDICAL RECORD NUMBER Notes from prior ED visits and [place] Controlled Substance Database  45 year old female presents to the ED with complaint of 3 to 4 days of cough, body aches and headache.  Patient has had all 3 COVID vaccines.  She denies any vomiting or diarrhea.  Chest x-ray was negative for pneumonia.  Respiratory panel was negative for COVID but positive for influenza A.  Patient was made aware.  She is encouraged to increase fluids, Tylenol or ibuprofen as needed for body  aches and fever and a prescription for Robitussin-AC was sent to the pharmacy as needed for cough and congestion every 6 hours.  Patient was given a work note as well.  ____________________________________________   FINAL CLINICAL IMPRESSION(S) / ED DIAGNOSES  Final diagnoses:  Influenza A     ED Discharge Orders         Ordered    guaiFENesin-codeine 100-10 MG/5ML syrup  Every 6 hours PRN        03/25/21 1400          *Please note:  Shelley Vargas was evaluated in Emergency Department on 03/25/2021 for the symptoms described in the history of present illness. She was evaluated in the context of the global COVID-19 pandemic, which necessitated consideration that the patient might be at risk for infection with the SARS-CoV-2 virus that causes COVID-19. Institutional protocols and algorithms that pertain to the evaluation of patients at risk for COVID-19 are in a state of rapid change based on information released by regulatory bodies including the  CDC and federal and [employer]. These policies and algorithms were followed during the patient's care in the ED.  Some ED evaluations and interventions may be delayed as a result of limited staffing during and the pandemic.*   Note:  This document was prepared using Dragon voice recognition software and may include unintentional dictation errors.    Tommi Rumps, PA-C 03/25/21 1405    Sharman Cheek, MD 03/27/21 [id number]

## 2021-03-25 NOTE — ED Triage Notes (Signed)
C/o body aches, cough and headache X 3 days. No known exposure to COVID. Pt alert and oriented X4, cooperative, RR even and unlabored, color WNL. Pt in NAD.

## 2021-04-20 ENCOUNTER — Emergency Department: Payer: 59

## 2021-04-20 ENCOUNTER — Emergency Department
Admission: EM | Admit: 2021-04-20 | Discharge: 2021-04-20 | Disposition: A | Discharge status: Home / Self Care | Payer: 59 | Attending: Emergency Medicine | Admitting: Emergency Medicine

## 2021-04-20 ENCOUNTER — Other Ambulatory Visit: Payer: Self-pay

## 2021-04-20 ENCOUNTER — Encounter: Payer: Self-pay | Admitting: Emergency Medicine

## 2021-04-20 DIAGNOSIS — W010XXA Fall on same level from slipping, tripping and stumbling without subsequent striking against object, initial encounter: Secondary | ICD-10-CM | POA: Diagnosis not present

## 2021-04-20 DIAGNOSIS — M542 Cervicalgia: Secondary | ICD-10-CM | POA: Diagnosis present

## 2021-04-20 DIAGNOSIS — M791 Myalgia, unspecified site: Secondary | ICD-10-CM | POA: Insufficient documentation

## 2021-04-20 DIAGNOSIS — Y92511 Restaurant or cafe as the place of occurrence of the external cause: Secondary | ICD-10-CM | POA: Insufficient documentation

## 2021-04-20 DIAGNOSIS — M79641 Pain in right hand: Secondary | ICD-10-CM | POA: Diagnosis not present

## 2021-04-20 DIAGNOSIS — M549 Dorsalgia, unspecified: Secondary | ICD-10-CM | POA: Diagnosis not present

## 2021-04-20 DIAGNOSIS — W19XXXA Unspecified fall, initial encounter: Secondary | ICD-10-CM

## 2021-04-20 DIAGNOSIS — M7918 Myalgia, other site: Secondary | ICD-10-CM

## 2021-04-20 MED ORDER — NAPROXEN 500 MG PO TABS: 500.0000 mg | ORAL_TABLET | Freq: Two times a day (BID) | ORAL | 0 refills | Status: DC

## 2021-04-20 MED ORDER — ORPHENADRINE CITRATE ER 100 MG PO TB12: 100.0000 mg | ORAL_TABLET | Freq: Two times a day (BID) | ORAL | 0 refills | Status: DC

## 2021-04-20 MED ORDER — CYCLOBENZAPRINE HCL 10 MG PO TABS
10.0000 mg | ORAL_TABLET | Freq: Once | ORAL | Status: AC
  Administered 2021-04-20: 10 mg via ORAL
  Filled 2021-04-20: qty 1

## 2021-04-20 MED ORDER — NAPROXEN 500 MG PO TABS
500.0000 mg | ORAL_TABLET | Freq: Once | ORAL | Status: AC
  Administered 2021-04-20: 500 mg via ORAL
  Filled 2021-04-20: qty 1

## 2021-04-20 NOTE — ED Provider Notes (Signed)
[facility] Emergency Department Provider Note   ____________________________________________   Event Date/Time   First MD Initiated Contact with Patient 04/20/21 1332     (approximate)  I have reviewed the triage vital signs and the nursing notes.   HISTORY  Chief Complaint Fall   HPI Shelley Vargas is a 45 y.o. female patient complaining neck pain, back pain, right hand pain secondary to a slip and fall at [employer].  Patient arrived via EMS denying LOC or head injury.  Patient denies radicular component to her neck or back pain.  No obvious deformity to the right hand.  Patient rates her pain as a 5/10.  Patient described pain as "achy".  No palliative measure prior to arrival.         Past Medical History:  Diagnosis Date  . Bronchitis   . Bronchitis   . Eczema   . Iron deficiency anemia   . Menometrorrhagia     Patient Active Problem List   Diagnosis Date Noted  . Routine general medical examination at a health care facility 06/13/2014  . Keloid 01/07/2014  . Eczema 05/28/2013  . Hand dermatitis 08/28/2012  . Class 1 obesity 07/11/2011  . Iron deficiency anemia 07/11/2011    Past Surgical History:  Procedure Laterality Date  . DILITATION & CURRETTAGE/HYSTROSCOPY WITH NOVASURE ABLATION N/A 12/06/2017   Procedure: DILATATION & CURETTAGE/HYSTEROSCOPY WITH MINERVA ENDOMETRIAL ABLATION;  Surgeon: Lazaro Arms, MD;  Location: [facility];  Service: Gynecology;  Laterality: N/A;  . rods in left leg    . TUBAL LIGATION  1997    Prior to Admission medications   Medication Sig Start Date End Date Taking? Authorizing Provider  naproxen (NAPROSYN) 500 MG tablet Take 1 tablet (500 mg total) by mouth 2 (two) times daily with a meal. 04/20/21  Yes Joni Reining, PA-C  orphenadrine (NORFLEX) 100 MG tablet Take 1 tablet (100 mg total) by mouth 2 (two) times daily. 04/20/21  Yes Joni Reining, PA-C    Allergies Penicillins  Family  History  Problem Relation Age of Onset  . Hypertension Mother   . Hypertension Father     Social History Social History   Tobacco Use  . Smoking status: Never Smoker  . Smokeless tobacco: Never Used  Vaping Use  . Vaping Use: Never used  Substance Use Topics  . Alcohol use: No    Alcohol/week: 1.0 standard drink    Types: 1 Glasses of wine per week  . Drug use: No    Review of Systems  Constitutional: No fever/chills Eyes: No visual changes. ENT: No sore throat. Cardiovascular: Denies chest pain. Respiratory: Denies shortness of breath. Gastrointestinal: No abdominal pain.  No nausea, no vomiting.  No diarrhea.  No constipation. Genitourinary: Negative for dysuria. Musculoskeletal: Neck, back, and right hand pain. Skin: Negative for rash. Neurological: Negative for headaches, focal weakness or numbness. Allergic/Immunilogical: Penicillin.  ____________________________________________   PHYSICAL EXAM:  VITAL SIGNS: ED Triage Vitals  Enc Vitals Group     BP 04/20/21 1225 (!) 144/83     Pulse Rate 04/20/21 1225 67     Resp 04/20/21 1225 17     Temp 04/20/21 1225 98.2 F (36.8 C)     Temp Source 04/20/21 1225 Oral     SpO2 04/20/21 1225 100 %     Weight 04/20/21 1220 196 lb 3.4 oz (89 kg)     Height 04/20/21 1220 5\' 4"  (1.626 m)     Head Circumference --  Peak Flow --      Pain Score 04/20/21 1220 5     Pain Loc --      Pain Edu? --      Excl. in GC? --    Constitutional: Alert and oriented. Well appearing and in no acute distress. Eyes: Conjunctivae are normal. PERRL. EOMI. Head: Atraumatic. Nose: No congestion/rhinnorhea. Mouth/Throat: Mucous membranes are moist.  Oropharynx non-erythematous. Neck: No stridor.  No cervical spine tenderness to palpation. Cardiovascular: Normal rate, regular rhythm. Grossly normal heart sounds.  Good peripheral circulation. Respiratory: Normal respiratory effort.  No retractions. Lungs CTAB. Gastrointestinal: Soft  and nontender. No distention. No abdominal bruits. No CVA tenderness. Genitourinary: Deferred Musculoskeletal: No lower extremity tenderness nor edema.  No joint effusions. Neurologic:  Normal speech and language. No gross focal neurologic deficits are appreciated. No gait instability. Skin:  Skin is warm, dry and intact. No rash noted.  No abrasion or ecchymosis. Psychiatric: Mood and affect are normal. Speech and behavior are normal.  ____________________________________________   LABS (all labs ordered are listed, but only abnormal results are displayed)  Labs Reviewed - No data to display ____________________________________________  EKG   ____________________________________________  RADIOLOGY I, Joni Reining, personally viewed and evaluated these images (plain radiographs) as part of my medical decision making, as well as reviewing the written report by the radiologist.  ED MD interpretation: No acute findings x-ray of the cervical spine, lumbar spine, right hand.  Official radiology report(s): DG Cervical Spine 2-3 Views  Result Date: 04/20/2021 CLINICAL DATA:  Recent fall with neck pain, initial encounter EXAM: CERVICAL SPINE - 3 VIEW COMPARISON:  None. FINDINGS: There is no evidence of cervical spine fracture or prevertebral soft tissue swelling. Alignment is normal. No other significant bone abnormalities are identified. IMPRESSION: No acute abnormality noted. Electronically Signed   By: Alcide Clever M.D.   On: 04/20/2021 15:31   DG Lumbar Spine 2-3 Views  Result Date: 04/20/2021 CLINICAL DATA:  Pain in upper back after fall. EXAM: LUMBAR SPINE - 2-3 VIEW COMPARISON:  No recent prior. FINDINGS: Paraspinal soft tissues are unremarkable. Pelvic calcifications consistent phleboliths. Diffuse mild degenerative change. No evidence of fracture. Minimal misalignment of the coccyx. This could be developmental. Again no evidence of fracture. IMPRESSION: Diffuse mild degenerative  change. No evidence of fracture. Specifically in the upper back region in the region of patient's pain no abnormality identified. Electronically Signed   By: Maisie Fus  Register   On: 04/20/2021 15:26   DG Hand Complete Right  Result Date: 04/20/2021 CLINICAL DATA:  Right hand pain after fall EXAM: RIGHT HAND - COMPLETE 3+ VIEW COMPARISON:  None. FINDINGS: There is no evidence of fracture or dislocation. There is no evidence of arthropathy or other focal bone abnormality. Soft tissues are unremarkable. IMPRESSION: Negative. Electronically Signed   By: Duanne Guess D.O.   On: 04/20/2021 15:28    ____________________________________________   PROCEDURES  Procedure(s) performed (including Critical Care):  Procedures   ____________________________________________   INITIAL IMPRESSION / ASSESSMENT AND PLAN / ED COURSE  As part of my medical decision making, I reviewed the following data within the electronic MEDICAL RECORD NUMBER         Patient complaining neck pain, back pain, and right hand pain secondary to a slip and fall.  There was no LOC or head injury.  Discussed no acute findings x-ray of the cervical spine, lumbar spine, right hand.  Patient complaining physical exam consistent with mild muscle skeletal pain secondary to a  slip and fall.  Patient given discharge care instructions and a prescription for anti-inflammatory medication muscle relaxer.  Patient vies follow-up PCP.      ____________________________________________   FINAL CLINICAL IMPRESSION(S) / ED DIAGNOSES  Final diagnoses:  Fall, initial encounter  Musculoskeletal pain     ED Discharge Orders         Ordered    orphenadrine (NORFLEX) 100 MG tablet  2 times daily        04/20/21 1542    naproxen (NAPROSYN) 500 MG tablet  2 times daily with meals        04/20/21 1542          *Please note:  Shelley Vargas was evaluated in Emergency Department on 04/20/2021 for the symptoms described in the history of  present illness. She was evaluated in the context of the global COVID-19 pandemic, which necessitated consideration that the patient might be at risk for infection with the SARS-CoV-2 virus that causes COVID-19. Institutional protocols and algorithms that pertain to the evaluation of patients at risk for COVID-19 are in a state of rapid change based on information released by regulatory bodies including the CDC and federal and state organizations. These policies and algorithms were followed during the patient's care in the ED.  Some ED evaluations and interventions may be delayed as a result of limited staffing during and the pandemic.*   Note:  This document was prepared using Dragon voice recognition software and may include unintentional dictation errors.    Joni Reining, PA-C 04/20/21 1547    Sharman Cheek, MD 04/27/21 [phone]

## 2021-04-20 NOTE — ED Notes (Signed)
See triage note  Presents s/p fall  States she slipped on wet floor at [employer] backwards  Hitting upper back   Having pain to upper back,neck and h/a  No LOC

## 2021-04-20 NOTE — ED Triage Notes (Signed)
Slipped and fell at [employer].  C/O right pinky pain and right shoulder pain.  No LOC.  Denies hitting head.  Per EMS report, VS wnl.  NAD

## 2021-04-20 NOTE — Discharge Instructions (Addendum)
No acute findings on x-ray of the cervical spine, right hand, and lumbar spine.  Read and follow discharge care instruction.  Take medication as directed.

## 2021-05-21 ENCOUNTER — Encounter: Payer: Self-pay | Admitting: Emergency Medicine

## 2021-05-26 ENCOUNTER — Encounter: Payer: Self-pay | Admitting: Nurse Practitioner

## 2021-05-26 ENCOUNTER — Other Ambulatory Visit: Payer: Self-pay

## 2021-05-26 ENCOUNTER — Ambulatory Visit (INDEPENDENT_AMBULATORY_CARE_PROVIDER_SITE_OTHER): Payer: 59 | Admitting: Nurse Practitioner

## 2021-05-26 VITALS — BP 136/76 | HR 64 | Temp 98.8°F | Ht 66.25 in | Wt 199.0 lb

## 2021-05-26 DIAGNOSIS — Z6831 Body mass index (BMI) 31.0-31.9, adult: Secondary | ICD-10-CM | POA: Diagnosis not present

## 2021-05-26 DIAGNOSIS — Z0001 Encounter for general adult medical examination with abnormal findings: Secondary | ICD-10-CM

## 2021-05-26 DIAGNOSIS — Z0000 Encounter for general adult medical examination without abnormal findings: Secondary | ICD-10-CM

## 2021-05-26 DIAGNOSIS — M545 Low back pain, unspecified: Secondary | ICD-10-CM | POA: Diagnosis not present

## 2021-05-26 DIAGNOSIS — Z1211 Encounter for screening for malignant neoplasm of colon: Secondary | ICD-10-CM

## 2021-05-26 DIAGNOSIS — Z1231 Encounter for screening mammogram for malignant neoplasm of breast: Secondary | ICD-10-CM

## 2021-05-26 NOTE — Progress Notes (Signed)
BP 136/76   Pulse 64   Temp 98.8 F (37.1 C)   Ht 5' 6.25" (1.683 m)   Wt 199 lb (90.3 kg)   SpO2 98%   BMI 31.88 kg/m    Subjective:    Patient ID: Shelley Vargas, female    DOB: 08-May-1976, 45 y.o.   MRN: [id number]  HPI: Shelley Vargas is a 45 y.o. female presenting on 05/26/2021 for comprehensive medical examination. Current medical complaints include:   Back pain-reports she had a fall a few months ago and has been trying to get in with a chiropractor.  Requesting referral today.  She currently lives with: self LMP: had ablation surgery; no menses since  Depression Screen done today and results listed below:  Depression screen [facility] 2/9 05/26/2021 01/01/2021 04/07/2020 09/25/2019  Decreased Interest 0 0 0 0  Down, Depressed, Hopeless 0 0 0 0  PHQ - 2 Score 0 0 0 0  Altered sleeping - - 2 -  Tired, decreased energy - - 2 -  Change in appetite - - 0 -  Feeling bad or failure about yourself  - - 0 -  Trouble concentrating - - 0 -  Moving slowly or fidgety/restless - - 0 -  Suicidal thoughts - - 0 -  PHQ-9 Score - - 4 -  Difficult doing work/chores - - Not difficult at all -    The patient does not have a history of falls. I did not complete a risk assessment for falls. A plan of care for falls was not documented.   Past Medical History:  Past Medical History:  Diagnosis Date   Bronchitis    Bronchitis    Eczema    Iron deficiency anemia    Menometrorrhagia     Surgical History:  Past Surgical History:  Procedure Laterality Date   DILITATION & CURRETTAGE/HYSTROSCOPY WITH NOVASURE ABLATION N/A 12/06/2017   Procedure: DILATATION & CURETTAGE/HYSTEROSCOPY WITH MINERVA ENDOMETRIAL ABLATION;  Surgeon: Lazaro Arms, MD;  Location: [facility];  Service: Gynecology;  Laterality: N/A;   rods in left leg     TUBAL LIGATION  1997    Medications:  No current outpatient medications on file prior to visit.   No current facility-administered medications on file prior to visit.     Allergies:  Allergies  Allergen Reactions   Penicillins Other (See Comments)    Yeast infection Has patient had a PCN reaction causing immediate rash, facial/tongue/throat swelling, SOB or lightheadedness with hypotension: No Has patient had a PCN reaction causing severe rash involving mucus membranes or skin necrosis: No Has patient had a PCN reaction that required hospitalization: no Has patient had a PCN reaction occurring within the last 10 years: Yes If all of the above answers are "NO", then may proceed with Cephalosporin use.     Social History:  Social History   Socioeconomic History   Marital status: Single    Spouse name: Not on file   Number of children: Not on file   Years of education: Not on file   Highest education level: Not on file  Occupational History   Not on file  Tobacco Use   Smoking status: Not on file   Smokeless tobacco: Never  Vaping Use   Vaping Use: Never used  Substance and Sexual Activity   Alcohol use: No    Alcohol/week: 1.0 standard drink    Types: 1 Glasses of wine per week   Drug use: No   Sexual activity: Yes  Birth control/protection: None, Surgical    Comment: tubal and ablation  Other Topics Concern   Not on file  Social History Narrative   ** Merged History Encounter **       Social Determinants of Health   Financial Resource Strain: Not on file  Food Insecurity: Not on file  Transportation Needs: Not on file  Physical Activity: Not on file  Stress: Not on file  Social Connections: Not on file  Intimate Partner Violence: Not on file   Social History   Tobacco Use  Smoking Status Not on file  Smokeless Tobacco Never   Social History   Substance and Sexual Activity  Alcohol Use No   Alcohol/week: 1.0 standard drink   Types: 1 Glasses of wine per week    Family History:  Family History  Problem Relation Age of Onset   Hypertension Mother    Hypertension Father    Diabetes Maternal Grandfather      Past medical history, surgical history, medications, allergies, family history and social history reviewed with patient today and changes made to appropriate areas of the chart.   Review of Systems  Constitutional: Negative.   HENT: Negative.    Eyes: Negative.   Respiratory: Negative.    Cardiovascular: Negative.   Gastrointestinal: Negative.   Genitourinary: Negative.   Musculoskeletal: Negative.   Skin: Negative.   Neurological: Negative.   Psychiatric/Behavioral: Negative.        Objective:    BP 136/76   Pulse 64   Temp 98.8 F (37.1 C)   Ht 5' 6.25" (1.683 m)   Wt 199 lb (90.3 kg)   SpO2 98%   BMI 31.88 kg/m   Wt Readings from Last 3 Encounters:  05/26/21 199 lb (90.3 kg)  04/20/21 196 lb 3.4 oz (89 kg)  03/25/21 196 lb (88.9 kg)    Physical Exam Vitals and nursing note reviewed. Exam conducted with a chaperone present (AW).  Constitutional:      General: She is not in acute distress.    Appearance: Normal appearance. She is normal weight. She is not ill-appearing or toxic-appearing.  HENT:     Head: Normocephalic and atraumatic.     Right Ear: Tympanic membrane, ear canal and external ear normal.     Left Ear: Tympanic membrane, ear canal and external ear normal.     Nose: Nose normal. No congestion or rhinorrhea.     Mouth/Throat:     Mouth: Mucous membranes are moist.     Pharynx: Oropharynx is clear. No oropharyngeal exudate.  Eyes:     General: No scleral icterus.    Extraocular Movements: Extraocular movements intact.     Pupils: Pupils are equal, round, and reactive to light.  Neck:     Vascular: No carotid bruit.  Cardiovascular:     Rate and Rhythm: Normal rate and regular rhythm.     Pulses: Normal pulses.     Heart sounds: Normal heart sounds. No murmur heard. Pulmonary:     Effort: Pulmonary effort is normal. No respiratory distress.     Breath sounds: No wheezing or rhonchi.  Chest:  Breasts:    Right: Normal. No inverted nipple,  mass, nipple discharge or skin change.     Left: Normal. No inverted nipple, mass, nipple discharge or skin change.  Abdominal:     General: Abdomen is flat. Bowel sounds are normal. There is no distension.     Palpations: Abdomen is soft.     Tenderness: There  is no abdominal tenderness.  Genitourinary:    Comments: deferred Musculoskeletal:        General: No swelling or tenderness. Normal range of motion.     Cervical back: Normal range of motion and neck supple. No rigidity or tenderness.     Right lower leg: No edema.     Left lower leg: No edema.  Skin:    General: Skin is warm and dry.     Capillary Refill: Capillary refill takes less than 2 seconds.     Coloration: Skin is not jaundiced or pale.  Neurological:     General: No focal deficit present.     Mental Status: She is alert and oriented to person, place, and time.     Motor: No weakness.     Gait: Gait normal.  Psychiatric:        Mood and Affect: Mood normal.        Behavior: Behavior normal.        Thought Content: Thought content normal.        Judgment: Judgment normal.       Assessment & Plan:   Problem List Items Addressed This Visit   None Visit Diagnoses     Annual physical exam    -  Primary   Relevant Orders   Lipid Panel   CBC with Differential   COMPLETE METABOLIC PANEL WITH GFR   Hemoglobin A1c   Acute midline low back pain without sciatica       Relevant Orders   Ambulatory referral to Chiropractic   BMI 31.0-31.9,adult       Relevant Orders   Lipid Panel   COMPLETE METABOLIC PANEL WITH GFR   Hemoglobin A1c   VITAMIN D 25 Hydroxy (Vit-D Deficiency, Fractures)   Encounter for screening mammogram for malignant neoplasm of breast       Relevant Orders   MM 3D SCREEN BREAST BILATERAL   Screening for colon cancer       Relevant Orders   Ambulatory referral to Gastroenterology        Follow up plan: Return for pending lab work.   LABORATORY TESTING:  - Pap smear: up to  date  IMMUNIZATIONS:   - Tdap: Tetanus vaccination status reviewed: last tetanus booster within 10 years. - Influenza: Up to date - Pneumovax: Not applicable - Prevnar: Not applicable - HPV: Not applicable - Zostavax vaccine: Not applicable - COVID-19 vaccine: 2 doses of Moderna with 1 booster   SCREENING: -Mammogram: Ordered today  - Colonoscopy: Ordered today  - Bone Density: Not applicable  -Hearing Test: Not applicable  -Spirometry: Not applicable   PATIENT COUNSELING:   Advised to take 1 mg of folate supplement per day if capable of pregnancy.   Sexuality: Discussed sexually transmitted diseases, partner selection, use of condoms, avoidance of unintended pregnancy  and contraceptive alternatives.   Advised to avoid cigarette smoking.  I discussed with the patient that most people either abstain from alcohol or drink within safe limits (<=14/week and <=4 drinks/occasion for males, <=7/weeks and <= 3 drinks/occasion for females) and that the risk for alcohol disorders and other health effects rises proportionally with the number of drinks per week and how often a drinker exceeds daily limits.  Discussed cessation/primary prevention of drug use and availability of treatment for abuse.   Diet: Encouraged to adjust caloric intake to maintain  or achieve ideal body weight, to reduce intake of dietary saturated fat and total fat, to limit sodium intake by  avoiding high sodium foods and not adding table salt, and to maintain adequate dietary potassium and calcium preferably from fresh fruits, vegetables, and low-fat dairy products.    stressed the importance of regular exercise  Injury prevention: Discussed safety belts, safety helmets, smoke detector, smoking near bedding or upholstery.   Dental health: Discussed importance of regular tooth brushing, flossing, and dental visits.    NEXT PREVENTATIVE PHYSICAL DUE IN 1 YEAR. Return for pending lab work.

## 2021-05-27 ENCOUNTER — Other Ambulatory Visit: Payer: 59

## 2021-05-27 DIAGNOSIS — Z6831 Body mass index (BMI) 31.0-31.9, adult: Secondary | ICD-10-CM

## 2021-05-27 DIAGNOSIS — Z1159 Encounter for screening for other viral diseases: Secondary | ICD-10-CM

## 2021-05-27 DIAGNOSIS — Z0000 Encounter for general adult medical examination without abnormal findings: Secondary | ICD-10-CM

## 2021-05-28 LAB — COMPLETE METABOLIC PANEL WITH GFR
AG Ratio: 1.5 (calc) (ref 1.0–2.5)
ALT: 17 U/L (ref 6–29)
AST: 12 U/L (ref 10–35)
Albumin: 4.1 g/dL (ref 3.6–5.1)
Alkaline phosphatase (APISO): 72 U/L (ref 31–125)
BUN: 9 mg/dL (ref 7–25)
CO2: 26 mmol/L (ref 20–32)
Calcium: 9 mg/dL (ref 8.6–10.2)
Chloride: 105 mmol/L (ref 98–110)
Creat: 0.71 mg/dL (ref 0.50–1.10)
GFR, Est African American: 118 mL/min/{1.73_m2} (ref >=60)
GFR, Est Non African American: 102 mL/min/{1.73_m2} (ref >=60)
Globulin: 2.8 g/dL (calc) (ref 1.9–3.7)
Glucose, Bld: 98 mg/dL (ref 65–99)
Potassium: 4.1 mmol/L (ref 3.5–5.3)
Sodium: 139 mmol/L (ref 135–146)
Total Bilirubin: 0.3 mg/dL (ref 0.2–1.2)
Total Protein: 6.9 g/dL (ref 6.1–8.1)

## 2021-05-28 LAB — CBC WITH DIFFERENTIAL/PLATELET
Absolute Monocytes: 325 cells/uL (ref 200–950)
Basophils Absolute: 39 cells/uL (ref 0–200)
Basophils Relative: 0.7 %
Eosinophils Absolute: 160 cells/uL (ref 15–500)
Eosinophils Relative: 2.9 %
HCT: 39.9 % (ref 35.0–45.0)
Hemoglobin: 12.9 g/dL (ref 11.7–15.5)
Lymphs Abs: 1590 cells/uL (ref 850–3900)
MCH: 28.7 pg (ref 27.0–33.0)
MCHC: 32.3 g/dL (ref 32.0–36.0)
MCV: 88.9 fL (ref 80.0–100.0)
MPV: 11.2 fL (ref 7.5–12.5)
Monocytes Relative: 5.9 %
Neutro Abs: 3388 cells/uL (ref 1500–7800)
Neutrophils Relative %: 61.6 %
Platelets: 247 10*3/uL (ref 140–400)
RBC: 4.49 10*6/uL (ref 3.80–5.10)
RDW: 11.8 % (ref 11.0–15.0)
Total Lymphocyte: 28.9 %
WBC: 5.5 10*3/uL (ref 3.8–10.8)

## 2021-05-28 LAB — HEMOGLOBIN A1C
Hgb A1c MFr Bld: 5.2 % of total Hgb (ref <5.7)
Mean Plasma Glucose: 103 mg/dL
eAG (mmol/L): 5.7 mmol/L

## 2021-05-28 LAB — HEPATITIS C ANTIBODY
Hepatitis C Ab: NONREACTIVE
SIGNAL TO CUT-OFF: 0.04 (ref <1.00)

## 2021-05-28 LAB — VITAMIN D 25 HYDROXY (VIT D DEFICIENCY, FRACTURES): Vit D, 25-Hydroxy: 22 ng/mL — ABNORMAL LOW (ref 30–100)

## 2021-05-28 LAB — LIPID PANEL
Cholesterol: 185 mg/dL (ref <200)
HDL: 51 mg/dL (ref >=50)
LDL Cholesterol (Calc): 118 mg/dL (calc) — ABNORMAL HIGH
Non-HDL Cholesterol (Calc): 134 mg/dL (calc) — ABNORMAL HIGH (ref <130)
Total CHOL/HDL Ratio: 3.6 (calc) (ref <5.0)
Triglycerides: 70 mg/dL (ref <150)

## 2021-06-08 ENCOUNTER — Encounter: Payer: Self-pay | Admitting: Nurse Practitioner

## 2021-06-29 ENCOUNTER — Telehealth (INDEPENDENT_AMBULATORY_CARE_PROVIDER_SITE_OTHER): Payer: Self-pay | Admitting: Gastroenterology

## 2021-06-29 DIAGNOSIS — Z1211 Encounter for screening for malignant neoplasm of colon: Secondary | ICD-10-CM

## 2021-06-29 MED ORDER — PEG 3350-KCL-NA BICARB-NACL 420 G PO SOLR: 4000.0000 mL | Freq: Once | ORAL | 0 refills | Status: AC

## 2021-06-29 NOTE — Progress Notes (Signed)
Gastroenterology Pre-Procedure Review  Request Date: 07/20/21 Requesting Physician: Dr. Allegra Lai  PATIENT REVIEW QUESTIONS: The patient responded to the following health history questions as indicated:    1. Are you having any GI issues? no 2. Do you have a personal history of Polyps? no 3. Do you have a family history of Colon Cancer or Polyps? no 4. Diabetes Mellitus? no 5. Joint replacements in the past 12 months?no 6. Major health problems in the past 3 months?no 7. Any artificial heart valves, MVP, or defibrillator?no    MEDICATIONS & ALLERGIES:    Patient reports the following regarding taking any anticoagulation/antiplatelet therapy:   Plavix, Coumadin, Eliquis, Xarelto, Lovenox, Pradaxa, Brilinta, or Effient? no Aspirin? no  Patient confirms/reports the following medications:  No current outpatient medications on file.   No current facility-administered medications for this visit.    Patient confirms/reports the following allergies:  Allergies  Allergen Reactions   Penicillins Other (See Comments)    Yeast infection Has patient had a PCN reaction causing immediate rash, facial/tongue/throat swelling, SOB or lightheadedness with hypotension: No Has patient had a PCN reaction causing severe rash involving mucus membranes or skin necrosis: No Has patient had a PCN reaction that required hospitalization: no Has patient had a PCN reaction occurring within the last 10 years: Yes If all of the above answers are "NO", then may proceed with Cephalosporin use.     No orders of the defined types were placed in this encounter.   AUTHORIZATION INFORMATION Primary Insurance: 1D#: Group #:  Secondary Insurance: 1D#: Group #:  SCHEDULE INFORMATION: Date: 07/20/21 Time: Location: [facility]

## 2021-07-08 ENCOUNTER — Ambulatory Visit (INDEPENDENT_AMBULATORY_CARE_PROVIDER_SITE_OTHER): Payer: 59 | Admitting: Nurse Practitioner

## 2021-07-08 ENCOUNTER — Encounter: Payer: Self-pay | Admitting: Nurse Practitioner

## 2021-07-08 ENCOUNTER — Other Ambulatory Visit: Payer: Self-pay

## 2021-07-08 VITALS — BP 124/86 | HR 74 | Temp 98.5°F | Ht 66.26 in | Wt 199.8 lb

## 2021-07-08 DIAGNOSIS — N898 Other specified noninflammatory disorders of vagina: Secondary | ICD-10-CM | POA: Diagnosis not present

## 2021-07-08 LAB — MICROSCOPIC MESSAGE

## 2021-07-08 LAB — URINALYSIS, ROUTINE W REFLEX MICROSCOPIC
Bacteria, UA: NONE SEEN /HPF
Bilirubin Urine: NEGATIVE
Glucose, UA: NEGATIVE
Hyaline Cast: NONE SEEN /LPF
Leukocytes,Ua: NEGATIVE
Nitrite: NEGATIVE
Protein, ur: NEGATIVE
Specific Gravity, Urine: 1.027 (ref 1.001–1.035)
WBC, UA: NONE SEEN /HPF (ref 0–5)
pH: 5.5 (ref 5.0–8.0)

## 2021-07-08 LAB — WET PREP FOR TRICH, YEAST, CLUE

## 2021-07-08 NOTE — Progress Notes (Signed)
Subjective:    Patient ID: Shelley Vargas, female    DOB: [birth year], 45 y.o.   MRN: [id number]  HPI: Shelley Vargas is a 45 y.o. female presenting for vaginal irritation.  Chief Complaint  Patient presents with   Vaginal Discharge    Used soap that may have caused irritation/discharge    VAGINAL DISCHARGE LMP: gets spotting around this time of the month. Also reports she recently just shaved. Duration: weeks Discharge description: clear and thin  Pruritus: no Dysuria: no Malodorous: no Urinary frequency: no Fevers: no Abdominal pain: no  Sexual activity: not currently sexually active Recent antibiotic use: no Context: better  Treatments attempted: nothing tried  Allergies  Allergen Reactions   Penicillins Other (See Comments)    Yeast infection Has patient had a PCN reaction causing immediate rash, facial/tongue/throat swelling, SOB or lightheadedness with hypotension: No Has patient had a PCN reaction causing severe rash involving mucus membranes or skin necrosis: No Has patient had a PCN reaction that required hospitalization: no Has patient had a PCN reaction occurring within the last 10 years: Yes If all of the above answers are "NO", then may proceed with Cephalosporin use.     No outpatient encounter medications on file as of 07/08/2021.   No facility-administered encounter medications on file as of 07/08/2021.    Patient Active Problem List   Diagnosis Date Noted   Routine general medical examination at a health care facility 06/13/2014   Keloid 01/07/2014   Eczema 05/28/2013   Hand dermatitis 08/28/2012   Class 1 obesity 07/11/2011   Iron deficiency anemia 07/11/2011    Past Medical History:  Diagnosis Date   Bronchitis    Bronchitis    Eczema    Iron deficiency anemia    Menometrorrhagia     Relevant past medical, surgical, family and social history reviewed and updated as indicated. Interim medical history since our last visit  reviewed.  Review of Systems Per HPI unless specifically indicated above     Objective:    BP 124/86   Pulse 74   Temp 98.5 F (36.9 C)   Ht 5' 6.26" (1.683 m)   Wt 199 lb 12.8 oz (90.6 kg)   SpO2 98%   BMI 32.00 kg/m   Wt Readings from Last 3 Encounters:  07/08/21 199 lb 12.8 oz (90.6 kg)  05/26/21 199 lb (90.3 kg)  04/20/21 196 lb 3.4 oz (89 kg)    Physical Exam Vitals and nursing note reviewed. Exam conducted with a chaperone present (AW).  Constitutional:      General: She is not in acute distress.    Appearance: Normal appearance. She is not toxic-appearing.  Genitourinary:    General: Normal vulva.     Exam position: Lithotomy position.     Pubic Area: No rash or pubic lice.      Labia:        Right: No tenderness.        Left: No tenderness.      Vagina: No vaginal discharge.     Cervix: Normal.     Uterus: Normal.      Adnexa: Right adnexa normal and left adnexa normal.  Lymphadenopathy:     Lower Body: No right inguinal adenopathy. No left inguinal adenopathy.  Neurological:     Mental Status: She is alert and oriented to person, place, and time.     Motor: No weakness.     Gait: Gait normal.  Psychiatric:  Mood and Affect: Mood normal.        Behavior: Behavior normal.        Thought Content: Thought content normal.        Judgment: Judgment normal.      Assessment & Plan:  1. Vaginal irritation Acute, resolved.  Likely secondary to soap.  Discussed vaginal hygiene at length today with patient.  Wet prep negative for clue cells or yeast.  Urinalysis clear of infection.  Reassurance provided-do not use soap on vulva.  Return to clinic if symptoms return or worsen.  - WET PREP FOR TRICH, YEAST, CLUE - Urinalysis, Routine w reflex microscopic    Follow up plan: Return if symptoms worsen or fail to improve.

## 2021-07-18 ENCOUNTER — Encounter: Payer: Self-pay | Admitting: Nurse Practitioner

## 2021-07-20 ENCOUNTER — Ambulatory Visit
Admission: RE | Admit: 2021-07-20 | Discharge: 2021-07-20 | Disposition: A | Discharge status: Home / Self Care | Payer: 59 | Source: Ambulatory Visit | Attending: Gastroenterology | Admitting: Gastroenterology

## 2021-07-20 ENCOUNTER — Ambulatory Visit: Payer: 59 | Admitting: Registered Nurse

## 2021-07-20 ENCOUNTER — Encounter
Admission: RE | Disposition: A | Discharge status: Home / Self Care | Payer: Self-pay | Source: Ambulatory Visit | Attending: Gastroenterology

## 2021-07-20 ENCOUNTER — Encounter: Payer: Self-pay | Admitting: Gastroenterology

## 2021-07-20 DIAGNOSIS — Z880 Allergy status to penicillin: Secondary | ICD-10-CM | POA: Insufficient documentation

## 2021-07-20 DIAGNOSIS — K635 Polyp of colon: Secondary | ICD-10-CM | POA: Diagnosis not present

## 2021-07-20 DIAGNOSIS — K5730 Diverticulosis of large intestine without perforation or abscess without bleeding: Secondary | ICD-10-CM | POA: Diagnosis not present

## 2021-07-20 DIAGNOSIS — Z1211 Encounter for screening for malignant neoplasm of colon: Secondary | ICD-10-CM | POA: Insufficient documentation

## 2021-07-20 HISTORY — PX: COLONOSCOPY WITH PROPOFOL: SHX5780

## 2021-07-20 SURGERY — COLONOSCOPY WITH PROPOFOL
Anesthesia: General

## 2021-07-20 MED ORDER — PROPOFOL 10 MG/ML IV BOLUS
INTRAVENOUS | Status: DC | PRN
  Administered 2021-07-20: 80 mg via INTRAVENOUS

## 2021-07-20 MED ORDER — SODIUM CHLORIDE 0.9 % IV SOLN: INTRAVENOUS | Status: DC

## 2021-07-20 MED ORDER — PROPOFOL 500 MG/50ML IV EMUL
INTRAVENOUS | Status: DC | PRN
  Administered 2021-07-20: 140 ug/kg/min via INTRAVENOUS

## 2021-07-20 MED ORDER — GLYCOPYRROLATE 0.2 MG/ML IJ SOLN
INTRAMUSCULAR | Status: DC | PRN
  Administered 2021-07-20: .2 mg via INTRAVENOUS

## 2021-07-20 MED ORDER — LIDOCAINE HCL (CARDIAC) PF 100 MG/5ML IV SOSY
PREFILLED_SYRINGE | INTRAVENOUS | Status: DC | PRN
  Administered 2021-07-20: 60 mg via INTRAVENOUS

## 2021-07-20 NOTE — Anesthesia Preprocedure Evaluation (Signed)
Anesthesia Evaluation  Patient identified by MRN, date of birth, ID band Patient awake    Reviewed: Allergy & Precautions, NPO status , Patient's Chart, lab work & pertinent test results  Airway Mallampati: II  TM Distance: >3 FB Neck ROM: Full    Dental  (+) Teeth Intact Braces:   Pulmonary neg pulmonary ROS,    breath sounds clear to auscultation       Cardiovascular negative cardio ROS   Rhythm:Regular Rate:Normal     Neuro/Psych negative neurological ROS  negative psych ROS   GI/Hepatic negative GI ROS, Neg liver ROS,   Endo/Other  negative endocrine ROS  Renal/GU negative Renal ROS     Musculoskeletal negative musculoskeletal ROS (+)   Abdominal   Peds  Hematology  (+) anemia ,   Anesthesia Other Findings Bronchitis    Eczema    Iron deficiency anemia    Menometrorrhagia       Reproductive/Obstetrics                             Anesthesia Physical  Anesthesia Plan  ASA: 1  Anesthesia Plan: General   Post-op Pain Management:    Induction: Intravenous  PONV Risk Score and Plan: 2 and Propofol infusion and TIVA  Airway Management Planned: Natural Airway and Nasal Cannula  Additional Equipment:   Intra-op Plan:   Post-operative Plan: Extubation in OR  Informed Consent: I have reviewed the patients History and Physical, chart, labs and discussed the procedure including the risks, benefits and alternatives for the proposed anesthesia with the patient or authorized representative who has indicated his/her understanding and acceptance.       Plan Discussed with: CRNA, Anesthesiologist and Surgeon  Anesthesia Plan Comments:         Anesthesia Quick Evaluation

## 2021-07-20 NOTE — Transfer of Care (Signed)
Immediate Anesthesia Transfer of Care Note  Patient: Shelley Vargas  Procedure(s) Performed: COLONOSCOPY WITH PROPOFOL  Patient Location: PACU  Anesthesia Type:General  Level of Consciousness: sedated  Airway & Oxygen Therapy: Patient Spontanous Breathing  Post-op Assessment: Report given to RN and Post -op Vital signs reviewed and stable  Post vital signs: Reviewed and stable  Last Vitals:  Vitals Value Taken Time  BP    Temp    Pulse 67   Resp 12   SpO2 98     Last Pain:  Vitals:   07/20/21 0936  TempSrc: Temporal         Complications: No notable events documented.

## 2021-07-20 NOTE — Op Note (Signed)
[facility]astroenterology Patient Name: Shelley Vargas Procedure Date: 07/20/2021 9:42 AM MRN: [id number] Account #: [id number] Date of Birth: 1976/10/17 Admit Type: Outpatient Age: 45 Room: [facility] ENDO ROOM 3 Gender: Female Note Status: Finalized Procedure:             Colonoscopy Indications:           Screening for colorectal malignant neoplasm, This is                         the patient's first colonoscopy Providers:             Toney Reil MD, MD Referring MD:          Guadlupe Spanish. Bradly Bienenstock NP (Referring MD) Medicines:             General Anesthesia Complications:         No immediate complications. Estimated blood loss: None. Procedure:             Pre-Anesthesia Assessment:                        - Prior to the procedure, a History and Physical was                         performed, and patient medications and allergies were                         reviewed. The patient is competent. The risks and                         benefits of the procedure and the sedation options and                         risks were discussed with the patient. All questions                         were answered and informed consent was obtained.                         Patient identification and proposed procedure were                         verified by the physician, the nurse, the                         anesthesiologist, the anesthetist and the technician                         in the pre-procedure area in the procedure room in the                         endoscopy suite. Mental Status Examination: alert and                         oriented. Airway Examination: normal oropharyngeal                         airway and neck mobility. Respiratory Examination:  clear to auscultation. CV Examination: normal.                         Prophylactic Antibiotics: The patient does not require                         prophylactic antibiotics. Prior  Anticoagulants: The                         patient has taken no previous anticoagulant or                         antiplatelet agents. ASA Grade Assessment: I - A                         normal, healthy patient. After reviewing the risks and                         benefits, the patient was deemed in satisfactory                         condition to undergo the procedure. The anesthesia                         plan was to use general anesthesia. Immediately prior                         to administration of medications, the patient was                         re-assessed for adequacy to receive sedatives. The                         heart rate, respiratory rate, oxygen saturations,                         blood pressure, adequacy of pulmonary ventilation, and                         response to care were monitored throughout the                         procedure. The physical status of the patient was                         re-assessed after the procedure.                        After obtaining informed consent, the colonoscope was                         passed under direct vision. Throughout the procedure,                         the patient's blood pressure, pulse, and oxygen                         saturations were monitored continuously. The  Colonoscope was introduced through the anus and                         advanced to the the terminal ileum, with                         identification of the appendiceal orifice and IC                         valve. The colonoscopy was performed without                         difficulty. The patient tolerated the procedure well.                         The quality of the bowel preparation was evaluated                         using the BBPS [facility] Bowel Preparation Scale) with                         scores of: Right Colon = 3, Transverse Colon = 3 and                         Left Colon = 3 (entire mucosa seen well with  no                         residual staining, small fragments of stool or opaque                         liquid). The total BBPS score equals 9. Findings:      The perianal and digital rectal examinations were normal. Pertinent       negatives include normal sphincter tone and no palpable rectal lesions.      The terminal ileum appeared normal.      A 5 mm polyp was found in the descending colon. The polyp was sessile.       The polyp was removed with a cold snare. Resection and retrieval were       complete.      The retroflexed view of the distal rectum and anal verge was normal and       showed no anal or rectal abnormalities.      A few diverticula were found in the sigmoid colon. Impression:            - The examined portion of the ileum was normal.                        - One 5 mm polyp in the descending colon, removed with                         a cold snare. Resected and retrieved.                        - The distal rectum and anal verge are normal on                         retroflexion view.                        -  Diverticulosis in the sigmoid colon. Recommendation:        - Discharge patient to home (with escort).                        - Resume previous diet today.                        - Continue present medications.                        - Await pathology results.                        - Repeat colonoscopy in 7-10 years for surveillance                         based on pathology results. Procedure Code(s):     --- Professional ---                        [phone], Colonoscopy, flexible; with removal of                         tumor(s), polyp(s), or other lesion(s) by snare                         technique Diagnosis Code(s):     --- Professional ---                        Z12.11, Encounter for screening for malignant neoplasm                         of colon                        K63.5, Polyp of colon                        K57.30, Diverticulosis of large intestine  without                         perforation or abscess without bleeding CPT copyright 2019 American Medical Association. All rights reserved. The codes documented in this report are preliminary and upon coder review may  be revised to meet current compliance requirements. Dr. Libby Maw Toney Reil MD, MD 07/20/2021 10:17:19 AM This report has been signed electronically. Number of Addenda: 0 Note Initiated On: 07/20/2021 9:42 AM Scope Withdrawal Time: 0 hours 10 minutes 2 seconds  Total Procedure Duration: 0 hours 13 minutes 25 seconds  Estimated Blood Loss:  Estimated blood loss: none.      [facility]

## 2021-07-20 NOTE — H&P (Signed)
  Arlyss Repress, MD [address]  [address]  [place], [place] [address]  Main: [phone]  Fax: [phone] Pager: [phone]  Primary Care Physician:  Valentino Nose, NP Primary Gastroenterologist:  Dr. Arlyss Repress  Pre-Procedure History & Physical: HPI:  Shelley Vargas is a 45 y.o. female is here for an colonoscopy.   Past Medical History:  Diagnosis Date   Bronchitis    Bronchitis    Eczema    Iron deficiency anemia    Menometrorrhagia     Past Surgical History:  Procedure Laterality Date   DILITATION & CURRETTAGE/HYSTROSCOPY WITH NOVASURE ABLATION N/A 12/06/2017   Procedure: DILATATION & CURETTAGE/HYSTEROSCOPY WITH MINERVA ENDOMETRIAL ABLATION;  Surgeon: Lazaro Arms, MD;  Location: [facility];  Service: Gynecology;  Laterality: N/A;   rods in left leg     TUBAL LIGATION  1997    Prior to Admission medications   Not on File    Allergies as of 06/29/2021 - Review Complete 06/29/2021  Allergen Reaction Noted   Penicillins Other (See Comments) 02/17/2012    Family History  Problem Relation Age of Onset   Hypertension Mother    Hypertension Father    Diabetes Maternal Grandfather     Social History   Socioeconomic History   Marital status: Single    Spouse name: Not on file   Number of children: Not on file   Years of education: Not on file   Highest education level: Not on file  Occupational History   Not on file  Tobacco Use   Smoking status: Never   Smokeless tobacco: Never  Vaping Use   Vaping Use: Never used  Substance and Sexual Activity   Alcohol use: No    Alcohol/week: 1.0 standard drink    Types: 1 Glasses of wine per week   Drug use: No   Sexual activity: Yes    Birth control/protection: None, Surgical    Comment: tubal and ablation  Other Topics Concern   Not on file  Social History Narrative   ** Merged History Encounter **       Social Determinants of Health   Financial Resource Strain: Not on file  Food  Insecurity: Not on file  Transportation Needs: Not on file  Physical Activity: Not on file  Stress: Not on file  Social Connections: Not on file  Intimate Partner Violence: Not on file    Review of Systems: See HPI, otherwise negative ROS  Physical Exam: BP (!) 153/80   Pulse (!) 59   Temp (!) 97.3 F (36.3 C) (Temporal)   Resp 18   Ht 5\' 6"  (1.676 m)   Wt 85.7 kg   SpO2 100%   BMI 30.51 kg/m  General:   Alert,  pleasant and cooperative in NAD Head:  Normocephalic and atraumatic. Neck:  Supple; no masses or thyromegaly. Lungs:  Clear throughout to auscultation.    Heart:  Regular rate and rhythm. Abdomen:  Soft, nontender and nondistended. Normal bowel sounds, without guarding, and without rebound.   Neurologic:  Alert and  oriented x4;  grossly normal neurologically.  Impression/Plan: Shelley Vargas is here for an colonoscopy to be performed for colon cancer screening  Risks, benefits, limitations, and alternatives regarding  colonoscopy have been reviewed with the patient.  Questions have been answered.  All parties agreeable.   Tilman Neat, MD  07/20/2021, 9:51 AM

## 2021-07-20 NOTE — Anesthesia Postprocedure Evaluation (Signed)
Anesthesia Post Note  Patient: Shelley Vargas  Procedure(s) Performed: COLONOSCOPY WITH PROPOFOL  Patient location during evaluation: Phase II Anesthesia Type: General Level of consciousness: awake and alert, awake and oriented Pain management: pain level controlled Vital Signs Assessment: post-procedure vital signs reviewed and stable Respiratory status: spontaneous breathing, nonlabored ventilation and respiratory function stable Cardiovascular status: blood pressure returned to baseline and stable Postop Assessment: no apparent nausea or vomiting Anesthetic complications: no   No notable events documented.   Last Vitals:  Vitals:   07/20/21 1028 07/20/21 1038  BP: 129/86 133/78  Pulse: 83 73  Resp: (!) 22   Temp:    SpO2: 100% 100%    Last Pain:  Vitals:   07/20/21 1038  TempSrc:   PainSc: 0-No pain                 Manfred Arch

## 2021-07-21 ENCOUNTER — Encounter: Payer: Self-pay | Admitting: Gastroenterology

## 2021-07-21 LAB — SURGICAL PATHOLOGY

## 2021-07-22 ENCOUNTER — Encounter: Payer: Self-pay | Admitting: Gastroenterology

## 2021-08-04 ENCOUNTER — Other Ambulatory Visit: Payer: Self-pay

## 2021-08-04 ENCOUNTER — Ambulatory Visit (HOSPITAL_COMMUNITY)
Admission: RE | Admit: 2021-08-04 | Discharge: 2021-08-04 | Disposition: A | Discharge status: Home / Self Care | Payer: 59 | Source: Ambulatory Visit | Attending: Nurse Practitioner | Admitting: Nurse Practitioner

## 2021-08-04 DIAGNOSIS — Z1231 Encounter for screening mammogram for malignant neoplasm of breast: Secondary | ICD-10-CM | POA: Insufficient documentation

## 2021-12-02 ENCOUNTER — Other Ambulatory Visit: Payer: Self-pay | Admitting: Nurse Practitioner

## 2021-12-03 ENCOUNTER — Other Ambulatory Visit: Payer: Self-pay

## 2021-12-03 ENCOUNTER — Encounter: Payer: Self-pay | Admitting: Nurse Practitioner

## 2021-12-03 ENCOUNTER — Ambulatory Visit (INDEPENDENT_AMBULATORY_CARE_PROVIDER_SITE_OTHER): Payer: Self-pay | Admitting: Nurse Practitioner

## 2021-12-03 ENCOUNTER — Other Ambulatory Visit: Payer: Self-pay | Admitting: Nurse Practitioner

## 2021-12-03 VITALS — BP 136/80 | HR 64 | Temp 98.7°F | Resp 18 | Ht 67.0 in | Wt 202.0 lb

## 2021-12-03 DIAGNOSIS — Z23 Encounter for immunization: Secondary | ICD-10-CM

## 2021-12-03 DIAGNOSIS — B9689 Other specified bacterial agents as the cause of diseases classified elsewhere: Secondary | ICD-10-CM

## 2021-12-03 DIAGNOSIS — N760 Acute vaginitis: Secondary | ICD-10-CM

## 2021-12-03 DIAGNOSIS — N898 Other specified noninflammatory disorders of vagina: Secondary | ICD-10-CM | POA: Diagnosis not present

## 2021-12-03 LAB — URINALYSIS, ROUTINE W REFLEX MICROSCOPIC
Bilirubin Urine: NEGATIVE
Glucose, UA: NEGATIVE
Hyaline Cast: NONE SEEN /LPF
Ketones, ur: NEGATIVE
Nitrite: NEGATIVE
Protein, ur: NEGATIVE
Specific Gravity, Urine: 1.025 (ref 1.001–1.035)
pH: 5.5 (ref 5.0–8.0)

## 2021-12-03 LAB — WET PREP FOR TRICH, YEAST, CLUE

## 2021-12-03 LAB — MICROSCOPIC MESSAGE

## 2021-12-03 MED ORDER — METRONIDAZOLE 500 MG PO TABS: 500.0000 mg | ORAL_TABLET | Freq: Two times a day (BID) | ORAL | 0 refills | Status: AC

## 2021-12-03 NOTE — Progress Notes (Signed)
Subjective:    Patient ID: Shelley Vargas, female    DOB: 07-05-1976, 46 y.o.   MRN: [id number]  HPI: Shelley Vargas is a 46 y.o. female presenting for vaginal odor.  Chief Complaint  Patient presents with   Office Visit    Vaginal irritation Want flu shot and Covid   VAGINAL ODOR Using new Dove soap, thinks her vagina is smelling different and wants to make sure she does not have a urinary tract or yeast infection. Duration: days Discharge description: no  Pruritus: no Dysuria: no Malodorous: no Urinary frequency: no Fevers: no Abdominal pain: no  Sexual activity: 1 sexual partner, is not worried about STI and declines testing today. Recent antibiotic use: no Context: stable  Treatments attempted: no  Allergies  Allergen Reactions   Penicillins Other (See Comments)    Yeast infection Has patient had a PCN reaction causing immediate rash, facial/tongue/throat swelling, SOB or lightheadedness with hypotension: No Has patient had a PCN reaction causing severe rash involving mucus membranes or skin necrosis: No Has patient had a PCN reaction that required hospitalization: no Has patient had a PCN reaction occurring within the last 10 years: Yes If all of the above answers are "NO", then may proceed with Cephalosporin use.     Outpatient Encounter Medications as of 12/03/2021  Medication Sig   metroNIDAZOLE (FLAGYL) 500 MG tablet Take 1 tablet (500 mg total) by mouth 2 (two) times daily for 7 days.   No facility-administered encounter medications on file as of 12/03/2021.    Patient Active Problem List   Diagnosis Date Noted   Colon cancer screening 06/13/2014   Keloid 01/07/2014   Eczema 05/28/2013   Hand dermatitis 08/28/2012   Class 1 obesity 07/11/2011   Iron deficiency anemia 07/11/2011    Past Medical History:  Diagnosis Date   Bronchitis    Bronchitis    Eczema    Iron deficiency anemia    Menometrorrhagia     Relevant past medical, surgical,  family and social history reviewed and updated as indicated. Interim medical history since our last visit reviewed.  Review of Systems Per HPI unless specifically indicated above     Objective:    BP 136/80 (BP Location: Left Arm, Patient Position: Sitting, Cuff Size: Large)    Pulse 64    Temp 98.7 F (37.1 C) (Temporal)    Resp 18    Ht 5\' 7"  (1.702 m)    Wt 202 lb (91.6 kg)    SpO2 99%    BMI 31.64 kg/m   Wt Readings from Last 3 Encounters:  12/03/21 202 lb (91.6 kg)  07/20/21 189 lb (85.7 kg)  07/08/21 199 lb 12.8 oz (90.6 kg)    Physical Exam Vitals and nursing note reviewed.  Constitutional:      General: She is not in acute distress.    Appearance: Normal appearance. She is not toxic-appearing.  Pulmonary:     Effort: Pulmonary effort is normal. No respiratory distress.  Genitourinary:    Comments: Patient declines and prefers to self test for wet prep Skin:    Coloration: Skin is not jaundiced or pale.     Findings: No erythema.  Neurological:     Mental Status: She is alert and oriented to person, place, and time.  Psychiatric:        Mood and Affect: Mood normal.        Behavior: Behavior normal.        Thought Content: Thought content  normal.        Judgment: Judgment normal.      Assessment & Plan:  1. Foul smelling vaginal discharge Acute.  We discussed vaginal health at length today.  She should not insert soap into the vagina. She should use water only when cleaning the vagina.  Wet prep positive for clue cells.  Treat with oral Flagyl 500 mg twice daily for 7 days per patient's request.  Discussed side effects.  UA shows trace blood and leukocyte esterase, some bacteria under microscope.  Will send urine for culture and hold off on treatment until culture results.  Patient reports she is getting ready to start menses.   - WET PREP FOR TRICH, YEAST, CLUE - Urinalysis, Routine w reflex microscopic    Follow up plan: Return if symptoms worsen or fail to  improve.

## 2021-12-04 LAB — URINE CULTURE
MICRO NUMBER:: 12837631
Result:: NO GROWTH
SPECIMEN QUALITY:: ADEQUATE

## 2021-12-13 ENCOUNTER — Telehealth: Payer: Self-pay

## 2022-01-03 ENCOUNTER — Ambulatory Visit (INDEPENDENT_AMBULATORY_CARE_PROVIDER_SITE_OTHER): Payer: 59 | Admitting: Nurse Practitioner

## 2022-01-03 ENCOUNTER — Other Ambulatory Visit: Payer: Self-pay

## 2022-01-03 ENCOUNTER — Encounter: Payer: Self-pay | Admitting: Nurse Practitioner

## 2022-01-04 NOTE — Progress Notes (Signed)
Patient initially scheduled for complete physical examination.  She had a complete physical examination less than 1 year ago.  With no new concerns, appointment cancelled.

## 2022-01-06 NOTE — Telephone Encounter (Signed)
Erroneous encounter. Please disregard.

## 2022-02-12 ENCOUNTER — Other Ambulatory Visit: Payer: Self-pay

## 2022-02-12 ENCOUNTER — Emergency Department: Payer: 59

## 2022-02-12 ENCOUNTER — Emergency Department
Admission: EM | Admit: 2022-02-12 | Discharge: 2022-02-12 | Disposition: A | Discharge status: Home / Self Care | Payer: 59 | Attending: Student in an Organized Health Care Education/Training Program | Admitting: Student in an Organized Health Care Education/Training Program

## 2022-02-12 DIAGNOSIS — N939 Abnormal uterine and vaginal bleeding, unspecified: Secondary | ICD-10-CM | POA: Diagnosis not present

## 2022-02-12 LAB — URINALYSIS, ROUTINE W REFLEX MICROSCOPIC
Bacteria, UA: NONE SEEN
Bilirubin Urine: NEGATIVE
Glucose, UA: NEGATIVE mg/dL
Ketones, ur: NEGATIVE mg/dL
Leukocytes,Ua: NEGATIVE
Nitrite: NEGATIVE
Protein, ur: NEGATIVE mg/dL
Specific Gravity, Urine: 1.014 (ref 1.005–1.030)
pH: 5 (ref 5.0–8.0)

## 2022-02-12 LAB — CBC WITH DIFFERENTIAL/PLATELET
Abs Immature Granulocytes: 0.02 10*3/uL (ref 0.00–0.07)
Basophils Absolute: 0.1 10*3/uL (ref 0.0–0.1)
Basophils Relative: 1 %
Eosinophils Absolute: 0.1 10*3/uL (ref 0.0–0.5)
Eosinophils Relative: 1 %
HCT: 39.3 % (ref 36.0–46.0)
Hemoglobin: 12.6 g/dL (ref 12.0–15.0)
Immature Granulocytes: 0 %
Lymphocytes Relative: 25 %
Lymphs Abs: 1.9 10*3/uL (ref 0.7–4.0)
MCH: 27.8 pg (ref 26.0–34.0)
MCHC: 32.1 g/dL (ref 30.0–36.0)
MCV: 86.8 fL (ref 80.0–100.0)
Monocytes Absolute: 0.4 10*3/uL (ref 0.1–1.0)
Monocytes Relative: 6 %
Neutro Abs: 5.1 10*3/uL (ref 1.7–7.7)
Neutrophils Relative %: 67 %
Platelets: 240 10*3/uL (ref 150–400)
RBC: 4.53 MIL/uL (ref 3.87–5.11)
RDW: 12 % (ref 11.5–15.5)
WBC: 7.6 10*3/uL (ref 4.0–10.5)
nRBC: 0 % (ref 0.0–0.2)

## 2022-02-12 LAB — POC URINE PREG, ED: Preg Test, Ur: NEGATIVE

## 2022-02-12 NOTE — ED Provider Notes (Signed)
? ?[facility] ?Provider Note ? ? ? Event Date/Time  ? First MD Initiated Contact with Patient 02/12/22 0708   ?  (approximate) ? ? ?History  ? ?Vaginal Bleeding ? ? ?HPI ? ?Shelley Vargas is a 46 y.o. female status post history of uterine ablation for heavy menstrual bleeding presents to the ER for evaluation of more 1 week of persistent bleeding.  Denies any discharge.  States bright red.  Mostly spotting is quite irregular for her.  Not currently on any birth control pills no history of trauma.  Denies any pain.  Not on any blood thinners. ?  ? ? ?Physical Exam  ? ?Triage Vital Signs: ?ED Triage Vitals  ?Enc Vitals Group  ?   BP 02/12/22 0617 (!) 148/87  ?   Pulse Rate 02/12/22 0617 66  ?   Resp 02/12/22 0617 18  ?   Temp 02/12/22 0617 97.9 ?F (36.6 ?C)  ?   Temp Source 02/12/22 0617 Oral  ?   SpO2 02/12/22 0617 97 %  ?   Weight 02/12/22 0614 205 lb (93 kg)  ?   Height 02/12/22 0614 5\' 5"  (1.651 m)  ?   Head Circumference --   ?   Peak Flow --   ?   Pain Score --   ?   Pain Loc --   ?   Pain Edu? --   ?   Excl. in GC? --   ? ? ?Most recent vital signs: ?Vitals:  ? 02/12/22 0700 02/12/22 0730  ?BP: 139/76 137/77  ?Pulse: 62 (!) 57  ?Resp:  18  ?Temp:    ?SpO2: 97% 98%  ? ? ? ?Constitutional: Alert  ?Eyes: Conjunctivae are normal.  ?Head: Atraumatic. ?Nose: No congestion/rhinnorhea. ?Mouth/Throat: Mucous membranes are moist.   ?Neck: Painless ROM.  ?Cardiovascular:   Good peripheral circulation. ?Respiratory: Normal respiratory effort.  No retractions.  ?Gastrointestinal: Soft and nontender.  ?Musculoskeletal:  no deformity ?Neurologic:  MAE spontaneously. No gross focal neurologic deficits are appreciated.  ?Skin:  Skin is warm, dry and intact. No rash noted. ?Psychiatric: Mood and affect are normal. Speech and behavior are normal. ? ? ? ?ED Results / Procedures / Treatments  ? ?Labs ?(all labs ordered are listed, but only abnormal results are displayed) ?Labs Reviewed  ?URINALYSIS, ROUTINE W  REFLEX MICROSCOPIC - Abnormal; Notable for the following components:  ?    Result Value  ? Color, Urine YELLOW (*)   ? APPearance HAZY (*)   ? Hgb urine dipstick LARGE (*)   ? All other components within normal limits  ?CBC WITH DIFFERENTIAL/PLATELET  ?POC URINE PREG, ED  ? ? ? ?EKG ? ? ? ? ?RADIOLOGY ?Please see ED Course for my review and interpretation. ? ?I personally reviewed all radiographic images ordered to evaluate for the above acute complaints and reviewed radiology reports and findings.  These findings were personally discussed with the patient.  Please see medical record for radiology report. ? ? ? ?PROCEDURES: ? ?Critical Care performed:  ? ?Procedures ? ? ?MEDICATIONS ORDERED IN ED: ?Medications - No data to display ? ? ?IMPRESSION / MDM / ASSESSMENT AND PLAN / ED COURSE  ?I reviewed the triage vital signs and the nursing notes. ?             ?               ? ?Differential diagnosis includes, but is not limited to, AUB, DU B, ectopic, fibroid ? ? ?  Patient presents with 1 week of vaginal spotting some bleeding.  Not on any birth control and I anticoagulation she is hemodynamically stable.  Her hemoglobin is normal.  She is not having any heavy life-threatening bleeding denies any pain.  Has had issues with this in the past.  Does not currently have OB/GYN.  Ultrasound ordered for the above differential results reviewed with patient she is not pregnant.  Structures start taking Motrin and to follow-up with OB/GYN discussed return precautions.  Patient agreeable to plan ? ?  ? ? ?FINAL CLINICAL IMPRESSION(S) / ED DIAGNOSES  ? ?Final diagnoses:  ?Vaginal bleeding  ? ? ? ?Rx / DC Orders  ? ?ED Discharge Orders   ? ? None  ? ?  ? ? ? ?Note:  This document was prepared using Dragon voice recognition software and may include unintentional dictation errors. ? ?  ?Willy Eddy, MD ?02/12/22 [id number] ? ?

## 2022-02-12 NOTE — ED Notes (Signed)
Green and purple top sent to the lab. 

## 2022-02-12 NOTE — ED Triage Notes (Signed)
Pt states had a uterine ablation and has been having vaginal bleeding. Pt denies fever, vomiting.  ?

## 2022-02-20 ENCOUNTER — Other Ambulatory Visit: Payer: Self-pay

## 2022-02-20 ENCOUNTER — Encounter (HOSPITAL_COMMUNITY): Payer: Self-pay | Admitting: Emergency Medicine

## 2022-02-20 ENCOUNTER — Emergency Department (HOSPITAL_COMMUNITY)
Admission: EM | Admit: 2022-02-20 | Discharge: 2022-02-20 | Disposition: A | Discharge status: Home / Self Care | Payer: 59 | Attending: Emergency Medicine | Admitting: Emergency Medicine

## 2022-02-20 DIAGNOSIS — N938 Other specified abnormal uterine and vaginal bleeding: Secondary | ICD-10-CM | POA: Insufficient documentation

## 2022-02-20 DIAGNOSIS — N898 Other specified noninflammatory disorders of vagina: Secondary | ICD-10-CM | POA: Diagnosis not present

## 2022-02-20 DIAGNOSIS — N939 Abnormal uterine and vaginal bleeding, unspecified: Secondary | ICD-10-CM | POA: Diagnosis not present

## 2022-02-20 LAB — CBC WITH DIFFERENTIAL/PLATELET
Abs Immature Granulocytes: 0.01 10*3/uL (ref 0.00–0.07)
Basophils Absolute: 0.1 10*3/uL (ref 0.0–0.1)
Basophils Relative: 1 %
Eosinophils Absolute: 0.1 10*3/uL (ref 0.0–0.5)
Eosinophils Relative: 1 %
HCT: 40.3 % (ref 36.0–46.0)
Hemoglobin: 13.2 g/dL (ref 12.0–15.0)
Immature Granulocytes: 0 %
Lymphocytes Relative: 27 %
Lymphs Abs: 1.6 10*3/uL (ref 0.7–4.0)
MCH: 28.7 pg (ref 26.0–34.0)
MCHC: 32.8 g/dL (ref 30.0–36.0)
MCV: 87.6 fL (ref 80.0–100.0)
Monocytes Absolute: 0.4 10*3/uL (ref 0.1–1.0)
Monocytes Relative: 6 %
Neutro Abs: 3.9 10*3/uL (ref 1.7–7.7)
Neutrophils Relative %: 65 %
Platelets: 253 10*3/uL (ref 150–400)
RBC: 4.6 MIL/uL (ref 3.87–5.11)
RDW: 12 % (ref 11.5–15.5)
WBC: 6 10*3/uL (ref 4.0–10.5)
nRBC: 0 % (ref 0.0–0.2)

## 2022-02-20 LAB — BASIC METABOLIC PANEL
Anion gap: 8 (ref 5–15)
BUN: 10 mg/dL (ref 6–20)
CO2: 23 mmol/L (ref 22–32)
Calcium: 8.8 mg/dL — ABNORMAL LOW (ref 8.9–10.3)
Chloride: 104 mmol/L (ref 98–111)
Creatinine, Ser: 0.79 mg/dL (ref 0.44–1.00)
GFR, Estimated: >60 mL/min (ref >60)
Glucose, Bld: 93 mg/dL (ref 70–99)
Potassium: 3.4 mmol/L — ABNORMAL LOW (ref 3.5–5.1)
Sodium: 135 mmol/L (ref 135–145)

## 2022-02-20 LAB — HCG, SERUM, QUALITATIVE: Preg, Serum: NEGATIVE

## 2022-02-20 MED ORDER — IBUPROFEN 400 MG PO TABS: 600.0000 mg | ORAL_TABLET | Freq: Once | ORAL | Status: DC

## 2022-02-20 NOTE — ED Provider Notes (Signed)
?[facility] EMERGENCY DEPARTMENT ?Provider Note ? ? ?CSN: [id number] ?Arrival date & time: 02/20/22  1846 ? ?  ? ?History ? ?Chief Complaint  ?Patient presents with  ? Vaginal Bleeding  ? vaginal odor  ? ? ?Shelley Vargas is a 46 y.o. female. ? ?Pt is a 46 yo female with a pmhx significant for bronchitis, anemia, and menometorrhagia.  She said she had an endometrial ablation years ago, but still has some irregular bleeding.  She feels like there is an odor to her vagina.  She is worried she has an infection.  She is not bleeding heavily, but has been spotting for the past few weeks.  She was seen at [facility] ED for DUB on 3/18.  An [place] was done which shows: ? ? ?IMPRESSION:  ?1. Fluid identified within the endometrial cavity. Endometrial  ?thickness measures 9 mm. If bleeding remains unresponsive to  ?hormonal or medical therapy, sonohysterogram should be considered  ?for focal lesion work-up. (Ref: Radiological Reasoning: Algorithmic  ?Workup of Abnormal Vaginal Bleeding with Endovaginal Sonography and  ?Sonohysterography. AJR 2008; [id number])  ?2. Left ovary cyst. If patient is premenopausal, no followup  ?recommended, if patient is postmenopausal, recommend follow-up  ?pelvic US in 3-6 months.  ? ?She was not put on any meds as her hgb was good and her vitals were good.   ? ?Pt said her obgyn has retired and she does not have a current obgyn.   ? ? ?  ? ?Home Medications ?Prior to Admission medications   ?Not on File  ?   ? ?Allergies    ?Penicillins   ? ?Review of Systems   ?Review of Systems  ?Genitourinary:  Positive for vaginal bleeding.  ?     Vaginal odor  ?All other systems reviewed and are negative. ? ?Physical Exam ?Updated Vital Signs ?BP (!) 155/89   Pulse 72   Temp 98.2 ?F (36.8 ?C) (Oral)   Resp 16   SpO2 96%  ?Physical Exam ?Vitals and nursing note reviewed. Exam conducted with a chaperone present.  ?Constitutional:   ?   Appearance: Normal appearance.  ?HENT:  ?   Head:  Normocephalic and atraumatic.  ?   Right Ear: External ear normal.  ?   Left Ear: External ear normal.  ?   Nose: Nose normal.  ?   Mouth/Throat:  ?   Mouth: Mucous membranes are moist.  ?   Pharynx: Oropharynx is clear.  ?Eyes:  ?   Extraocular Movements: Extraocular movements intact.  ?   Conjunctiva/sclera: Conjunctivae normal.  ?   Pupils: Pupils are equal, round, and reactive to light.  ?Cardiovascular:  ?   Rate and Rhythm: Normal rate and regular rhythm.  ?   Pulses: Normal pulses.  ?   Heart sounds: Normal heart sounds.  ?Pulmonary:  ?   Effort: Pulmonary effort is normal.  ?   Breath sounds: Normal breath sounds.  ?Abdominal:  ?   General: Abdomen is flat. Bowel sounds are normal.  ?   Palpations: Abdomen is soft.  ?Genitourinary: ?   Exam position: Lithotomy position.  ?   Vagina: Normal.  ?   Cervix: Normal.  ?   Uterus: Normal.   ?   Adnexa: Right adnexa normal and left adnexa normal.  ?   Comments: Very small amt of blood on q tip after taking swab.  No blood in vault. ?Musculoskeletal:     ?   General: Normal range of motion.  ?  Cervical back: Normal range of motion and neck supple.  ?Skin: ?   General: Skin is warm.  ?   Capillary Refill: Capillary refill takes less than 2 seconds.  ?Neurological:  ?   General: No focal deficit present.  ?   Mental Status: She is alert and oriented to person, place, and time.  ?Psychiatric:     ?   Mood and Affect: Mood normal.     ?   Behavior: Behavior normal.  ? ? ?ED Results / Procedures / Treatments   ?Labs ?(all labs ordered are listed, but only abnormal results are displayed) ?Labs Reviewed  ?BASIC METABOLIC PANEL - Abnormal; Notable for the following components:  ?    Result Value  ? Potassium 3.4 (*)   ? Calcium 8.8 (*)   ? All other components within normal limits  ?WET PREP, GENITAL  ?CBC WITH DIFFERENTIAL/PLATELET  ?HCG, SERUM, QUALITATIVE  ?I-STAT BETA HCG BLOOD, ED ([facility], [facility], [facility] ONLY)  ?GC/CHLAMYDIA PROBE AMP (Show Low) NOT AT [facility]   ? ? ?EKG ?None ? ?Radiology ?No results found. ? ?Procedures ?Procedures  ? ? ?Medications Ordered in ED ?Medications  ?ibuprofen (ADVIL) tablet 600 mg (has no administration in time range)  ? ? ?ED Course/ Medical Decision Making/ A&P ?  ?                        ?Medical Decision Making ?Amount and/or Complexity of Data Reviewed ?Labs: ordered. ? ? ?This patient presents to the ED for concern of dub and vaginal odor, this involves an extensive number of treatment options, and is a complaint that carries with it a high risk of complications and morbidity.  The differential diagnosis includes sti, yeast infection, bv ? ? ?Co morbidities that complicate the patient evaluation ? ?bronchitis, anemia, and menometorrhagia. ? ? ?Additional history obtained: ? ?Additional history obtained from epic chart review ? ? ? ?Lab Tests: ? ?I Ordered, and personally interpreted labs.  The pertinent results include:  cbc nl, bmp nl, preg neg ? ? ?Test Considered: ? ?Wet prep ? ? ?Problem List / ED Course: ? ?DUB and vaginal odor:  pelvic was done within minutes of pt's arrival to the room.  Unfortunately, there was too much saline in the wet prep tube, so the lab called the nurse and asked [place] to send another sample.  Since I already did a pelvic exam, I asked the nurse to have the pt self swab.  The lab lost that sample as well and canceled the test.  At this point, we asked the pt to do another swab.  Understandably, she was upset.  Unfortunately, she became very irate and said she was going to sue me and this hospital.  She would not submit to another swab and stormed out the door.  She did not receive any instructions and would not talk to me any more.  Preg neg and labs neg.  P ? ? ?Dispostion: ? ?Pt left ama.  She refused to consent to another swab. ? ? ? ? ? ? ? ?Final Clinical Impression(s) / ED Diagnoses ?Final diagnoses:  ?Vaginal odor  ?Dysfunctional uterine bleeding  ? ? ?Rx / DC Orders ?ED Discharge Orders   ? ? None   ? ?  ? ? ?  ?Jacalyn Lefevre, MD ?02/20/22 2224 ? ?

## 2022-02-20 NOTE — ED Notes (Signed)
Patient walked out of the ED before receiving discharge instructions. Patient left the ED in no acute distress.  ?

## 2022-02-20 NOTE — ED Triage Notes (Signed)
Pt reports vaginal bleeding and "fishy" vaginal odor x 2 weeks. ?

## 2022-02-21 ENCOUNTER — Encounter: Payer: Self-pay | Admitting: Obstetrics & Gynecology

## 2022-02-21 ENCOUNTER — Ambulatory Visit: Payer: 59 | Admitting: Obstetrics & Gynecology

## 2022-02-21 VITALS — BP 137/83 | HR 73 | Ht 65.0 in | Wt 206.0 lb

## 2022-02-21 DIAGNOSIS — B9689 Other specified bacterial agents as the cause of diseases classified elsewhere: Secondary | ICD-10-CM

## 2022-02-21 DIAGNOSIS — N760 Acute vaginitis: Secondary | ICD-10-CM | POA: Diagnosis not present

## 2022-02-21 DIAGNOSIS — N938 Other specified abnormal uterine and vaginal bleeding: Secondary | ICD-10-CM

## 2022-02-21 LAB — GC/CHLAMYDIA PROBE AMP (~~LOC~~) NOT AT ARMC
Chlamydia: NEGATIVE
Comment: NEGATIVE
Comment: NORMAL
Neisseria Gonorrhea: NEGATIVE

## 2022-02-21 MED ORDER — METRONIDAZOLE 0.75 % VA GEL: VAGINAL | 0 refills | Status: DC

## 2022-02-21 MED ORDER — SLYND 4 MG PO TABS
1.0000 | ORAL_TABLET | Freq: Every day | ORAL | 12 refills | Status: DC
Start: 2022-02-21 — End: 2022-05-05

## 2022-02-21 NOTE — Progress Notes (Signed)
? ? ? ? ?Chief Complaint  ?Patient presents with  ? vaginal bleeding  ?  Light spotting weekly  ? vaginal odor  ? ? ? ? ?46 y.o. [id number] No LMP recorded. Patient has had an ablation. The current method of family planning is tubal ligation. ? ?Outpatient Encounter Medications as of 02/21/2022  ?Medication Sig  ? Drospirenone (SLYND) 4 MG TABS Take 1 tablet by mouth daily.  ? metroNIDAZOLE (METROGEL VAGINAL) 0.75 % vaginal gel Nightly x 5 nights  ? ?No facility-administered encounter medications on file as of 02/21/2022.  ? ? ?Subjective ?Patient had endometrial ablation in 2019 and had no bleeding at all for nearly 4 years ?However she has been spotting almost weekly for the last few months ?Not heavy bleeding ?No clot ?But there often ?She is also complaining of a malodorous vaginal discharge that started when the spotting started ?Past Medical History:  ?Diagnosis Date  ? Bronchitis   ? Bronchitis   ? Eczema   ? Iron deficiency anemia   ? Menometrorrhagia   ? ? ?Past Surgical History:  ?Procedure Laterality Date  ? COLONOSCOPY WITH PROPOFOL N/A 07/20/2021  ? Procedure: COLONOSCOPY WITH PROPOFOL;  Surgeon: Toney Reil, MD;  Location: [facility] ENDOSCOPY;  Service: Gastroenterology;  Laterality: N/A;  ? DILITATION & CURRETTAGE/HYSTROSCOPY WITH NOVASURE ABLATION N/A 12/06/2017  ? Procedure: DILATATION & CURETTAGE/HYSTEROSCOPY WITH MINERVA ENDOMETRIAL ABLATION;  Surgeon: Lazaro Arms, MD;  Location: [facility] ORS;  Service: Gynecology;  Laterality: N/A;  ? rods in left leg    ? TUBAL LIGATION  1997  ? ? ?OB History   ? ? Gravida  ?5  ? Para  ?3  ? Term  ?3  ? Preterm  ?0  ? AB  ?2  ? Living  ?   ?  ? ? SAB  ?1  ? IAB  ?1  ? Ectopic  ?0  ? Multiple  ?   ? Live Births  ?   ?   ?  ?  ? ? ?Allergies  ?Allergen Reactions  ? Penicillins Other (See Comments)  ?  Yeast infection ?Has patient had a PCN reaction causing immediate rash, facial/tongue/throat swelling, SOB or lightheadedness with hypotension: No ?Has patient had a PCN  reaction causing severe rash involving mucus membranes or skin necrosis: No ?Has patient had a PCN reaction that required hospitalization: no ?Has patient had a PCN reaction occurring within the last 10 years: Yes ?If all of the above answers are "NO", then may proceed with Cephalosporin use. ?  ? ? ?Social History  ? ?Socioeconomic History  ? Marital status: Single  ?  Spouse name: Not on file  ? Number of children: Not on file  ? Years of education: Not on file  ? Highest education level: Not on file  ?Occupational History  ? Not on file  ?Tobacco Use  ? Smoking status: Never  ? Smokeless tobacco: Never  ?Vaping Use  ? Vaping Use: Never used  ?Substance and Sexual Activity  ? Alcohol use: No  ?  Alcohol/week: 1.0 standard drink  ?  Types: 1 Glasses of wine per week  ? Drug use: No  ? Sexual activity: Yes  ?  Birth control/protection: None, Surgical  ?  Comment: tubal and ablation  ?Other Topics Concern  ? Not on file  ?Social History Narrative  ? ** Merged History Encounter **  ?    ? ?Social Determinants of Health  ? ?Financial Resource Strain: Not on file  ?  Food Insecurity: Not on file  ?Transportation Needs: Not on file  ?Physical Activity: Not on file  ?Stress: Not on file  ?Social Connections: Not on file  ? ? ?Family History  ?Problem Relation Age of Onset  ? Hypertension Mother   ? Hypertension Father   ? Diabetes Maternal Grandfather   ? ? ?Medications:       ?Current Outpatient Medications:  ?  Drospirenone (SLYND) 4 MG TABS, Take 1 tablet by mouth daily., Disp: 28 tablet, Rfl: 12 ?  metroNIDAZOLE (METROGEL VAGINAL) 0.75 % vaginal gel, Nightly x 5 nights, Disp: 70 g, Rfl: 0 ? ?Objective ?Blood pressure 137/83, pulse 73, height 5\' 5"  (1.651 m), weight 206 lb (93.4 kg). ? ?General WDWN female NAD ?Vulva:  normal appearing vulva with no masses, tenderness or lesions ?Vagina:  normal mucosa, no discharge ?Cervix:  Normal no lesions ?Uterus:  normal size, contour, position, consistency, mobility,  non-tender ?Adnexa: ovaries:present,  normal adnexa in size, nontender and no masses ? ? ?Pertinent ROS ?No burning with urination, frequency or urgency ?No nausea, vomiting or diarrhea ?Nor fever chills or other constitutional symptoms ? ? ?Labs or studies ? ? ? ? ?Impression ?Diagnoses this Encounter:: ?  ICD-10-CM   ?1. BV (bacterial vaginosis)  N76.0   ? B96.89   ?  ?2. DUB (dysfunctional uterine bleeding)  N93.8   ?  ? ? ?Established relevant diagnosis(es): ? ? ?Plan/Recommendations: ?Meds ordered this encounter  ?Medications  ? Drospirenone (SLYND) 4 MG TABS  ?  Sig: Take 1 tablet by mouth daily.  ?  Dispense:  28 tablet  ?  Refill:  12  ? metroNIDAZOLE (METROGEL VAGINAL) 0.75 % vaginal gel  ?  Sig: Nightly x 5 nights  ?  Dispense:  70 g  ?  Refill:  0  ? ? ?Labs or Scans Ordered: ?No orders of the defined types were placed in this encounter. ? ? ?Management:: ?MetroGel for bacterial vaginosis ?Drospirenone progesterone only pill to see if we can stabilize and synchronize her endometrium ?Patient will contact me via MyChart and let me know how she is done the treatment prescribed ? ?Follow up ?Return if symptoms worsen or fail to improve. ? ? ? ? ? ? All questions were answered. ? ? ?

## 2022-02-23 ENCOUNTER — Encounter: Payer: Self-pay | Admitting: Obstetrics & Gynecology

## 2022-03-16 ENCOUNTER — Encounter: Payer: Self-pay | Admitting: Obstetrics & Gynecology

## 2022-03-18 ENCOUNTER — Encounter: Payer: Self-pay | Admitting: Obstetrics & Gynecology

## 2022-03-27 DIAGNOSIS — R262 Difficulty in walking, not elsewhere classified: Secondary | ICD-10-CM | POA: Diagnosis not present

## 2022-03-27 DIAGNOSIS — M7989 Other specified soft tissue disorders: Secondary | ICD-10-CM | POA: Diagnosis not present

## 2022-03-27 DIAGNOSIS — M7752 Other enthesopathy of left foot: Secondary | ICD-10-CM | POA: Diagnosis not present

## 2022-03-27 DIAGNOSIS — M79672 Pain in left foot: Secondary | ICD-10-CM | POA: Diagnosis not present

## 2022-03-27 DIAGNOSIS — M25511 Pain in right shoulder: Secondary | ICD-10-CM | POA: Diagnosis not present

## 2022-03-27 DIAGNOSIS — M25572 Pain in left ankle and joints of left foot: Secondary | ICD-10-CM | POA: Diagnosis not present

## 2022-03-27 DIAGNOSIS — M25531 Pain in right wrist: Secondary | ICD-10-CM | POA: Diagnosis not present

## 2022-03-27 DIAGNOSIS — M19072 Primary osteoarthritis, left ankle and foot: Secondary | ICD-10-CM | POA: Diagnosis not present

## 2022-04-05 ENCOUNTER — Ambulatory Visit: Payer: 59 | Admitting: Obstetrics & Gynecology

## 2022-05-05 ENCOUNTER — Telehealth: Payer: Self-pay

## 2022-05-05 MED ORDER — MEGESTROL ACETATE 40 MG PO TABS: ORAL_TABLET | ORAL | 1 refills | Status: DC

## 2022-05-05 NOTE — Telephone Encounter (Signed)
Pt states that ever since she had the ablation she has had bleeding on and off. She has seen Dr. Despina Hidden about it and he told her there isn't anything wrong. He usually send in megace for her. She has a trip planned and graduation parties to go to. She would like it called in for her today to [facility] in [place]. Advised patient that Dr. Despina Hidden isn't here today but I would ask Victorino Dike to send in for her. Patient advised to check with her pharmacy later today or we would call back if something different.

## 2022-05-05 NOTE — Telephone Encounter (Signed)
Pt called and stated that she would like the medication called in to stop the bleeding.  She would also like to speak to a nurse if possible.

## 2022-05-05 NOTE — Telephone Encounter (Signed)
Will rx megace 

## 2022-09-19 ENCOUNTER — Other Ambulatory Visit (HOSPITAL_COMMUNITY): Payer: Self-pay | Admitting: Obstetrics & Gynecology

## 2022-09-19 DIAGNOSIS — Z1231 Encounter for screening mammogram for malignant neoplasm of breast: Secondary | ICD-10-CM

## 2022-09-29 ENCOUNTER — Ambulatory Visit (HOSPITAL_COMMUNITY)
Admission: RE | Admit: 2022-09-29 | Discharge: 2022-09-29 | Disposition: A | Discharge status: Home / Self Care | Payer: 59 | Source: Ambulatory Visit | Attending: Obstetrics & Gynecology | Admitting: Obstetrics & Gynecology

## 2022-09-29 DIAGNOSIS — Z1231 Encounter for screening mammogram for malignant neoplasm of breast: Secondary | ICD-10-CM | POA: Insufficient documentation

## 2023-02-01 DIAGNOSIS — Z1331 Encounter for screening for depression: Secondary | ICD-10-CM | POA: Diagnosis not present

## 2023-02-01 DIAGNOSIS — Z124 Encounter for screening for malignant neoplasm of cervix: Secondary | ICD-10-CM | POA: Diagnosis not present

## 2023-02-01 DIAGNOSIS — Z113 Encounter for screening for infections with a predominantly sexual mode of transmission: Secondary | ICD-10-CM | POA: Diagnosis not present

## 2023-02-01 DIAGNOSIS — R87610 Atypical squamous cells of undetermined significance on cytologic smear of cervix (ASC-US): Secondary | ICD-10-CM | POA: Diagnosis not present

## 2023-02-01 DIAGNOSIS — R875 Abnormal microbiological findings in specimens from female genital organs: Secondary | ICD-10-CM | POA: Diagnosis not present

## 2023-02-01 DIAGNOSIS — B379 Candidiasis, unspecified: Secondary | ICD-10-CM | POA: Diagnosis not present

## 2023-02-01 DIAGNOSIS — Z01411 Encounter for gynecological examination (general) (routine) with abnormal findings: Secondary | ICD-10-CM | POA: Diagnosis not present

## 2023-02-01 DIAGNOSIS — Z1151 Encounter for screening for human papillomavirus (HPV): Secondary | ICD-10-CM | POA: Diagnosis not present

## 2023-02-01 DIAGNOSIS — Z114 Encounter for screening for human immunodeficiency virus [HIV]: Secondary | ICD-10-CM | POA: Diagnosis not present

## 2023-08-01 ENCOUNTER — Telehealth: Payer: Self-pay

## 2023-08-01 NOTE — Telephone Encounter (Signed)
Patient would like for a nurse to call her about a medication that Dr. Despina Hidden prescribed her.

## 2024-02-06 ENCOUNTER — Other Ambulatory Visit (HOSPITAL_COMMUNITY): Payer: Self-pay | Admitting: Obstetrics & Gynecology

## 2024-02-06 DIAGNOSIS — Z1231 Encounter for screening mammogram for malignant neoplasm of breast: Secondary | ICD-10-CM

## 2024-02-12 ENCOUNTER — Inpatient Hospital Stay (HOSPITAL_COMMUNITY): Admission: RE | Admit: 2024-02-12 | Source: Ambulatory Visit

## 2024-03-04 ENCOUNTER — Ambulatory Visit (HOSPITAL_COMMUNITY)
Admission: RE | Admit: 2024-03-04 | Discharge: 2024-03-04 | Disposition: A | Discharge status: Home / Self Care | Source: Ambulatory Visit | Attending: Obstetrics & Gynecology | Admitting: Obstetrics & Gynecology

## 2024-03-04 DIAGNOSIS — Z1231 Encounter for screening mammogram for malignant neoplasm of breast: Secondary | ICD-10-CM | POA: Insufficient documentation

## 2024-03-06 ENCOUNTER — Encounter: Payer: Self-pay | Admitting: Obstetrics & Gynecology

## 2024-06-04 ENCOUNTER — Ambulatory Visit (INDEPENDENT_AMBULATORY_CARE_PROVIDER_SITE_OTHER): Payer: Self-pay

## 2024-06-04 ENCOUNTER — Ambulatory Visit (INDEPENDENT_AMBULATORY_CARE_PROVIDER_SITE_OTHER): Payer: Self-pay | Admitting: Podiatry

## 2024-06-04 ENCOUNTER — Encounter: Payer: Self-pay | Admitting: Podiatry

## 2024-06-04 DIAGNOSIS — M21619 Bunion of unspecified foot: Secondary | ICD-10-CM

## 2024-06-04 DIAGNOSIS — M7752 Other enthesopathy of left foot: Secondary | ICD-10-CM

## 2024-06-04 DIAGNOSIS — M21611 Bunion of right foot: Secondary | ICD-10-CM

## 2024-06-04 DIAGNOSIS — M7751 Other enthesopathy of right foot: Secondary | ICD-10-CM

## 2024-06-04 DIAGNOSIS — M722 Plantar fascial fibromatosis: Secondary | ICD-10-CM

## 2024-06-04 DIAGNOSIS — M21612 Bunion of left foot: Secondary | ICD-10-CM

## 2024-06-04 NOTE — Progress Notes (Signed)
 Subjective:  Patient ID: Shelley Vargas, female    DOB: 05-05-1976,  MRN: [id number]  Chief Complaint  Patient presents with   Foot Pain    Pt is here due to bilateral foot and ankle pain states this has been going on for years, states in the mornings its hard to walk on both feet takes a while for the pain to go away if it does, states she can't wear certain shoes because of the pain in the right great toe believes she has a bunion.    48 y.o. female presents with the above complaint.  Patient presents with bilateral heel pain that has been off for quite some time is progressed gotten worse worse with ambulation is with pressure she states about 4 years.  Rest of the HPI as above   Review of Systems: Negative except as noted in the HPI. Denies N/V/F/Ch.  Past Medical History:  Diagnosis Date   Bronchitis    Bronchitis    Eczema    Iron deficiency anemia    Menometrorrhagia     Current Outpatient Medications:    fluticasone (FLONASE) 50 MCG/ACT nasal spray, Place 1 spray into the nose., Disp: , Rfl:    levofloxacin (LEVAQUIN) 500 MG tablet, Take 500 mg by mouth daily., Disp: , Rfl:    megestrol  (MEGACE ) 40 MG tablet, Take 3 x 5 days then 2 x 5 days then 1 daily til bleeding stops, Disp: 45 tablet, Rfl: 1   megestrol  (MEGACE ) 40 MG tablet, Take 1 tablet by mouth 2 (two) times daily., Disp: , Rfl:    metroNIDAZOLE  (METROGEL  VAGINAL) 0.75 % vaginal gel, Nightly x 5 nights, Disp: 70 g, Rfl: 0   predniSONE  (DELTASONE ) 20 MG tablet, Take 20 mg by mouth daily., Disp: , Rfl:   Social History   Tobacco Use  Smoking Status Never  Smokeless Tobacco Never    Allergies  Allergen Reactions   Penicillins Other (See Comments)    Yeast infection Has patient had a PCN reaction causing immediate rash, facial/tongue/throat swelling, SOB or lightheadedness with hypotension: No Has patient had a PCN reaction causing severe rash involving mucus membranes or skin necrosis: No Has patient had a  PCN reaction that required hospitalization: no Has patient had a PCN reaction occurring within the last 10 years: Yes If all of the above answers are NO, then may proceed with Cephalosporin use.    Objective:  There were no vitals filed for this visit. Body mass index is 34.28 kg/m. Constitutional Well developed. Well nourished.  Vascular Dorsalis pedis pulses palpable bilaterally. Posterior tibial pulses palpable bilaterally. Capillary refill normal to all digits.  No cyanosis or clubbing noted. Pedal hair growth normal.  Neurologic Normal speech. Oriented to person, place, and time. Epicritic sensation to light touch grossly present bilaterally.  Dermatologic Nails well groomed and normal in appearance. No open wounds. No skin lesions.  Orthopedic: Normal joint ROM without pain or crepitus bilaterally. No visible deformities. Tender to palpation at the calcaneal tuber bilaterally. No pain with calcaneal squeeze bilaterally. Ankle ROM diminished range of motion bilaterally. Silfverskiold Test: positive bilaterally.   Radiographs: Taken and reviewed. No acute fractures or dislocations. No evidence of stress fracture.  Plantar heel spur present. Posterior heel spur present.  Pes planovalgus foot structure  Assessment:   1. Plantar fasciitis of right foot   2. Plantar fasciitis of left foot    Plan:  Patient was evaluated and treated and all questions answered.  Plantar Fasciitis, bilaterally -  XR reviewed as above.  - Educated on icing and stretching. Instructions given.  - Injection delivered to the plantar fascia as below. - DME: Plantar fascial brace dispensed to support the medial longitudinal arch of the foot and offload pressure from the heel and prevent arch collapse during weightbearing - Pharmacologic management: None  Procedure: Injection Tendon/Ligament Location: Bilateral plantar fascia at the glabrous junction; medial approach. Skin Prep:  alcohol Injectate: 0.5 cc 0.5% marcaine plain, 0.5 cc of 1% Lidocaine , 0.5 cc kenalog  10. Disposition: Patient tolerated procedure well. Injection site dressed with a band-aid.  No follow-ups on file.

## 2024-06-27 ENCOUNTER — Encounter: Payer: Self-pay | Admitting: Podiatry

## 2024-06-27 ENCOUNTER — Ambulatory Visit (INDEPENDENT_AMBULATORY_CARE_PROVIDER_SITE_OTHER): Payer: Self-pay | Admitting: Podiatry

## 2024-06-27 ENCOUNTER — Ambulatory Visit (INDEPENDENT_AMBULATORY_CARE_PROVIDER_SITE_OTHER): Payer: Self-pay

## 2024-06-27 DIAGNOSIS — Z01818 Encounter for other preprocedural examination: Secondary | ICD-10-CM

## 2024-06-27 DIAGNOSIS — M21619 Bunion of unspecified foot: Secondary | ICD-10-CM

## 2024-06-27 DIAGNOSIS — Q666 Other congenital valgus deformities of feet: Secondary | ICD-10-CM

## 2024-06-27 DIAGNOSIS — M722 Plantar fascial fibromatosis: Secondary | ICD-10-CM

## 2024-06-27 NOTE — Progress Notes (Signed)
 Subjective:  Patient ID: Shelley Vargas, female    DOB: 03/01/1976,  MRN: [id number]  Chief Complaint  Patient presents with   Plantar Fasciitis    Pt stated that everything is good     48 y.o. female presents with the above complaint.  Patient presents with both bilateral plantar fasciitis.  She states that she is doing better injection helped considerably.  She still has some residual pain she would like to discuss treatment options for it.  She has tried shoe gear modification padding protecting offloading for the bunion deformity she wishes to have surgical intervention at this time as nothing has helped.  She also would like to do orthotics.   Review of Systems: Negative except as noted in the HPI. Denies N/V/F/Ch.  Past Medical History:  Diagnosis Date   Bronchitis    Bronchitis    Eczema    Iron deficiency anemia    Menometrorrhagia     Current Outpatient Medications:    fluticasone (FLONASE) 50 MCG/ACT nasal spray, Place 1 spray into the nose., Disp: , Rfl:    levofloxacin (LEVAQUIN) 500 MG tablet, Take 500 mg by mouth daily., Disp: , Rfl:    megestrol  (MEGACE ) 40 MG tablet, Take 3 x 5 days then 2 x 5 days then 1 daily til bleeding stops, Disp: 45 tablet, Rfl: 1   megestrol  (MEGACE ) 40 MG tablet, Take 1 tablet by mouth 2 (two) times daily., Disp: , Rfl:    metroNIDAZOLE  (METROGEL  VAGINAL) 0.75 % vaginal gel, Nightly x 5 nights, Disp: 70 g, Rfl: 0   predniSONE  (DELTASONE ) 20 MG tablet, Take 20 mg by mouth daily., Disp: , Rfl:   Social History   Tobacco Use  Smoking Status Never  Smokeless Tobacco Never    Allergies  Allergen Reactions   Penicillins Other (See Comments)    Yeast infection Has patient had a PCN reaction causing immediate rash, facial/tongue/throat swelling, SOB or lightheadedness with hypotension: No Has patient had a PCN reaction causing severe rash involving mucus membranes or skin necrosis: No Has patient had a PCN reaction that required  hospitalization: no Has patient had a PCN reaction occurring within the last 10 years: Yes If all of the above answers are NO, then may proceed with Cephalosporin use.    Objective:  There were no vitals filed for this visit. There is no height or weight on file to calculate BMI. Constitutional Well developed. Well nourished.  Vascular Dorsalis pedis pulses palpable bilaterally. Posterior tibial pulses palpable bilaterally. Capillary refill normal to all digits.  No cyanosis or clubbing noted. Pedal hair growth normal.  Neurologic Normal speech. Oriented to person, place, and time. Epicritic sensation to light touch grossly present bilaterally.  Dermatologic Nails well groomed and normal in appearance. No open wounds. No skin lesions.  Hallux abductovalgus deformity present Left 1st MPJ diminished range with pain. Left 1st TMT without gross hypermobility. Right 1st MPJ diminished range of motion  Right 1st TMT without gross hypermobility. Lesser digital contractures present bilaterally.  Orthopedic: Normal joint ROM without pain or crepitus bilaterally. No visible deformities. Tender to palpation at the calcaneal tuber bilaterally. No pain with calcaneal squeeze bilaterally. Ankle ROM diminished range of motion bilaterally. Silfverskiold Test: positive bilaterally.   Radiographs: Taken and reviewed. No acute fractures or dislocations. No evidence of stress fracture.  Plantar heel spur present. Posterior heel spur present.  Pes planovalgus foot structure  Assessment:   1. Bunion   2. Plantar fasciitis of right foot  3. Plantar fasciitis of left foot   4. Encounter for preoperative examination for general surgical procedure   5. Pes planovalgus    Plan:  Patient was evaluated and treated and all questions answered.  Plantar Fasciitis, bilaterally - XR reviewed as above.  - Educated on icing and stretching. Instructions given.  - second Injection delivered to the  plantar fascia as below. - DME: Plantar fascial brace dispensed to support the medial longitudinal arch of the foot and offload pressure from the heel and prevent arch collapse during weightbearing - Pharmacologic management: None  Procedure: Injection Tendon/Ligament Location: Bilateral plantar fascia at the glabrous junction; medial approach. Skin Prep: alcohol Injectate: 0.5 cc 0.5% marcaine plain, 0.5 cc of 1% Lidocaine , 0.5 cc kenalog  10. Disposition: Patient tolerated procedure well. Injection site dressed with a band-aid.  Pes planovalgus -I explained to patient the etiology of pes planovalgus and relationship with Planter fasciitis and various treatment options were discussed.  Given patient foot structure in the setting of Planter fasciitis I believe patient will benefit from custom-made orthotics to help control the hindfoot motion support the arch of the foot and take the stress away from plantar fascial.  Patient agrees with the plan like to proceed with orthotics -Patient was casted for orthotics  Hallux abductovalgus deformity, Right -XR as above. -Patient has failed all conservative therapy and wishes to proceed with surgical intervention. All risks, benefits, and alternatives discussed with patient. No guarantees given. Consent reviewed and signed by patient. Post-op course explained at length. -Planned procedures: Right chevron osteotomy with phalangeal osteotomy -Risk factors: None - I discussed my preoperative intra postop plan with the patient in extensive detail given the presence of bunion pain without any improvement from shoe gear modification patient will benefit from surgical intervention of the bunion deformity patient agrees with plan elected proceed with the surgery listed above -Informed surgical risk consent was reviewed and read aloud to the patient.  I reviewed the films.  I have discussed my findings with the patient in great detail.  I have discussed all risks  including but not limited to infection, stiffness, scarring, limp, disability, deformity, damage to blood vessels and nerves, numbness, poor healing, need for braces, arthritis, chronic pain, amputation, death.  All benefits and realistic expectations discussed in great detail.  I have made no promises as to the outcome.  I have provided realistic expectations.  I have offered the patient a 2nd opinion, which they have declined and assured me they preferred to proceed despite the risks

## 2024-07-02 NOTE — Progress Notes (Signed)
  Orthotic order placed will schedule for fitting when in

## 2024-07-11 ENCOUNTER — Telehealth: Payer: Self-pay | Admitting: Podiatry

## 2024-07-11 NOTE — Telephone Encounter (Signed)
 Received surgical consent form.  Called pt and she is wanting an estimated cost before scheduling.  I told her I would send it out for them to call her with that information.

## 2024-07-21 ENCOUNTER — Emergency Department
Admission: EM | Admit: 2024-07-21 | Discharge: 2024-07-21 | Disposition: A | Discharge status: Home / Self Care | Payer: Self-pay | Attending: Emergency Medicine | Admitting: Emergency Medicine

## 2024-07-21 ENCOUNTER — Emergency Department: Payer: Self-pay

## 2024-07-21 DIAGNOSIS — R002 Palpitations: Secondary | ICD-10-CM | POA: Insufficient documentation

## 2024-07-21 DIAGNOSIS — G4700 Insomnia, unspecified: Secondary | ICD-10-CM | POA: Insufficient documentation

## 2024-07-21 LAB — URINALYSIS, ROUTINE W REFLEX MICROSCOPIC
Bilirubin Urine: NEGATIVE
Glucose, UA: NEGATIVE mg/dL
Hgb urine dipstick: NEGATIVE
Ketones, ur: NEGATIVE mg/dL
Leukocytes,Ua: NEGATIVE
Nitrite: NEGATIVE
Protein, ur: NEGATIVE mg/dL
Specific Gravity, Urine: 1.028 (ref 1.005–1.030)
pH: 6 (ref 5.0–8.0)

## 2024-07-21 LAB — BASIC METABOLIC PANEL WITH GFR
Anion gap: 9 (ref 5–15)
BUN: 16 mg/dL (ref 6–20)
CO2: 25 mmol/L (ref 22–32)
Calcium: 8.8 mg/dL — ABNORMAL LOW (ref 8.9–10.3)
Chloride: 105 mmol/L (ref 98–111)
Creatinine, Ser: 0.8 mg/dL (ref 0.44–1.00)
GFR, Estimated: >60 mL/min (ref >60)
Glucose, Bld: 90 mg/dL (ref 70–99)
Potassium: 3.4 mmol/L — ABNORMAL LOW (ref 3.5–5.1)
Sodium: 139 mmol/L (ref 135–145)

## 2024-07-21 LAB — CBC
HCT: 41.2 % (ref 36.0–46.0)
Hemoglobin: 13.1 g/dL (ref 12.0–15.0)
MCH: 27.8 pg (ref 26.0–34.0)
MCHC: 31.8 g/dL (ref 30.0–36.0)
MCV: 87.3 fL (ref 80.0–100.0)
Platelets: 272 K/uL (ref 150–400)
RBC: 4.72 MIL/uL (ref 3.87–5.11)
RDW: 12.4 % (ref 11.5–15.5)
WBC: 6.5 K/uL (ref 4.0–10.5)
nRBC: 0 % (ref 0.0–0.2)

## 2024-07-21 LAB — POC URINE PREG, ED: Preg Test, Ur: NEGATIVE

## 2024-07-21 LAB — TROPONIN I (HIGH SENSITIVITY): Troponin I (High Sensitivity): 3 ng/L (ref <18)

## 2024-07-21 MED ORDER — TRAZODONE HCL 50 MG PO TABS: 50.0000 mg | ORAL_TABLET | Freq: Every day | ORAL | 0 refills | Status: AC

## 2024-07-21 NOTE — ED Provider Notes (Signed)
 [facility] Provider Note    Event Date/Time   First MD Initiated Contact with Patient 07/21/24 2142     (approximate)  History   Chief Complaint: Palpitations  HPI  Shelley Vargas is a 48 y.o. female with a past medical history of bronchitis, anemia, presents to the emergency department for palpitations.  According to the patient for last 3 to 4 months she states she has been getting hot flashes, she assumed this was due to being perimenopausal however the patient states she has not been sleeping very well either which she believes has been increasing her stress and she has been feeling palpitations at time.  Patient states today she checked her blood pressure and it was elevated and she was concerned so she came to the emergency department for evaluation.  Physical Exam   Triage Vital Signs: ED Triage Vitals  Encounter Vitals Group     BP 07/21/24 1854 (!) 154/85     Girls Systolic BP Percentile --      Girls Diastolic BP Percentile --      Boys Systolic BP Percentile --      Boys Diastolic BP Percentile --      Pulse Rate 07/21/24 1854 72     Resp 07/21/24 1854 18     Temp 07/21/24 1854 98.6 F (37 C)     Temp Source 07/21/24 1854 Oral     SpO2 07/21/24 1854 99 %     Weight 07/21/24 1851 205 lb (93 kg)     Height 07/21/24 1851 5' 4 (1.626 m)     Head Circumference --      Peak Flow --      Pain Score 07/21/24 1851 0     Pain Loc --      Pain Education --      Exclude from Growth Chart --     Most recent vital signs: Vitals:   07/21/24 1854  BP: (!) 154/85  Pulse: 72  Resp: 18  Temp: 98.6 F (37 C)  SpO2: 99%    General: Awake, no distress.  CV:  Good peripheral perfusion.  Regular rate and rhythm  Resp:  Normal effort.  Equal breath sounds bilaterally.  Abd:  No distention.  Soft, nontender.  No rebound or guarding.  ED Results / Procedures / Treatments   EKG  EKG viewed and interpreted by myself shows a normal sinus rhythm at  69 bpm with a narrow QRS, normal axis, normal intervals, no concerning ST changes.  RADIOLOGY  I have reviewed interpret the chest x-ray images.  No consolidation on my evaluation. Radiology is read the x-ray is negative   MEDICATIONS ORDERED IN ED: Medications - No data to display   IMPRESSION / MDM / ASSESSMENT AND PLAN / ED COURSE  I reviewed the triage vital signs and the nursing notes.  Patient's presentation is most consistent with acute presentation with potential threat to life or bodily function.  Patient presents to the emergency department with complaints of elevated blood pressure, palpitations, decreased sleep.  Patient states over the last few months she has not been sleeping well and has not been getting much sleep.  States she has been more stressed during the days and has been experiencing intermittent palpitations.  Overall the patient appears well, reassuring lab work today shows reassuring CBC reassuring chemistry normal urinalysis negative pregnancy test and reassuringly negative troponin.  Patient's EKG shows a normal sinus rhythm.  Chest x-ray is clear.  I suspect a lot of the patient symptoms could be due to her lack of sleep/insomnia.  Discussed with the patient limiting caffeine, and we will prescribe trazodone  for the next 30 days to see if this helps with the patient's sleep.  Otherwise patient will follow-up with her doctor.  Blood pressure mildly elevated today in the emergency department.  I recommended that the patient check her blood pressure once every other day or so for the next few weeks and write these levels down so that she can follow-up with her doctor to see if she needs to be on a blood pressure medication.  FINAL CLINICAL IMPRESSION(S) / ED DIAGNOSES   Insomnia Palpitations  Rx / DC Orders   Trazodone   Note:  This document was prepared using Dragon voice recognition software and may include unintentional dictation errors.   Dorothyann Drivers,  MD 07/21/24 2218

## 2024-07-21 NOTE — Discharge Instructions (Addendum)
 Please take your sleep aid as needed about 30 minutes to an hour before sleep.  Do not drink alcohol while taking this medication.  Please follow-up with your primary care doctor for recheck/reevaluation.  Should you continue to have palpitations please call the number provided for cardiology to arrange a follow-up appointment to discuss further workup.

## 2024-07-21 NOTE — ED Notes (Signed)
 Pt up to desk and reports she is urinating a lot and would like her urine checked. Pt given urine cup for sample

## 2024-07-21 NOTE — ED Triage Notes (Signed)
 Pt presents to the ED via POV from home with palpitations that started within the hour. Pt states that she has never had anything like this before. Denies pain. Reports recent htn. Denies hx of same. BP 154/85 at time of triage

## 2024-07-24 ENCOUNTER — Telehealth: Payer: Self-pay

## 2024-07-24 NOTE — Telephone Encounter (Signed)
 Called to schedule orthotic fitting/ pu Pt said she will call back to schedule once she's ready

## 2024-12-10 ENCOUNTER — Ambulatory Visit: Admitting: Podiatry

## 2024-12-10 DIAGNOSIS — Z01818 Encounter for other preprocedural examination: Secondary | ICD-10-CM | POA: Diagnosis not present

## 2024-12-10 DIAGNOSIS — M722 Plantar fascial fibromatosis: Secondary | ICD-10-CM | POA: Diagnosis not present

## 2024-12-10 DIAGNOSIS — M21619 Bunion of unspecified foot: Secondary | ICD-10-CM

## 2024-12-10 NOTE — Progress Notes (Signed)
 "  Subjective:  Patient ID: Shelley Vargas, female    DOB: 21-Mar-1976,  MRN: [id number]  Chief Complaint  Patient presents with   Bunions    49 y.o. female presents with the above complaint.  Patient presents with both bilateral plantar fasciitis.  She states that she is doing better injection helped considerably.  She still has some residual pain she would like to discuss treatment options for it.  She has tried shoe gear modification padding protecting offloading for the bunion deformity she wishes to have surgical intervention at this time as nothing has helped.  She would like to pick up her orthotics as well.  The bunion continues to be bothersome denies any other acute complaints.   Review of Systems: Negative except as noted in the HPI. Denies N/V/F/Ch.  Past Medical History:  Diagnosis Date   Bronchitis    Bronchitis    Eczema    Iron deficiency anemia    Menometrorrhagia     Current Outpatient Medications:    fluticasone (FLONASE) 50 MCG/ACT nasal spray, Place 1 spray into the nose., Disp: , Rfl:    levofloxacin (LEVAQUIN) 500 MG tablet, Take 500 mg by mouth daily., Disp: , Rfl:    megestrol  (MEGACE ) 40 MG tablet, Take 3 x 5 days then 2 x 5 days then 1 daily til bleeding stops, Disp: 45 tablet, Rfl: 1   megestrol  (MEGACE ) 40 MG tablet, Take 1 tablet by mouth 2 (two) times daily., Disp: , Rfl:    metroNIDAZOLE  (METROGEL  VAGINAL) 0.75 % vaginal gel, Nightly x 5 nights, Disp: 70 g, Rfl: 0   predniSONE  (DELTASONE ) 20 MG tablet, Take 20 mg by mouth daily., Disp: , Rfl:    traZODone  (DESYREL ) 50 MG tablet, Take 1 tablet (50 mg total) by mouth at bedtime., Disp: 30 tablet, Rfl: 0  Social History   Tobacco Use  Smoking Status Never  Smokeless Tobacco Never    Allergies  Allergen Reactions   Penicillins Other (See Comments)    Yeast infection Has patient had a PCN reaction causing immediate rash, facial/tongue/throat swelling, SOB or lightheadedness with hypotension: No Has  patient had a PCN reaction causing severe rash involving mucus membranes or skin necrosis: No Has patient had a PCN reaction that required hospitalization: no Has patient had a PCN reaction occurring within the last 10 years: Yes If all of the above answers are NO, then may proceed with Cephalosporin use.    Objective:  There were no vitals filed for this visit. There is no height or weight on file to calculate BMI. Constitutional Well developed. Well nourished.  Vascular Dorsalis pedis pulses palpable bilaterally. Posterior tibial pulses palpable bilaterally. Capillary refill normal to all digits.  No cyanosis or clubbing noted. Pedal hair growth normal.  Neurologic Normal speech. Oriented to person, place, and time. Epicritic sensation to light touch grossly present bilaterally.  Dermatologic Nails well groomed and normal in appearance. No open wounds. No skin lesions.  Hallux abductovalgus deformity present Left 1st MPJ diminished range with pain. Left 1st TMT without gross hypermobility. Right 1st MPJ diminished range of motion  Right 1st TMT without gross hypermobility. Lesser digital contractures present bilaterally.  Orthopedic: Normal joint ROM without pain or crepitus bilaterally. No visible deformities. Tender to palpation at the calcaneal tuber bilaterally. No pain with calcaneal squeeze bilaterally. Ankle ROM diminished range of motion bilaterally. Silfverskiold Test: positive bilaterally.   Radiographs: Taken and reviewed. No acute fractures or dislocations. No evidence of stress fracture.  Plantar  heel spur present. Posterior heel spur present.  Pes planovalgus foot structure  Assessment:   1. Bunion   2. Plantar fasciitis of right foot   3. Plantar fasciitis of left foot   4. Encounter for preoperative examination for general surgical procedure     Plan:  Patient was evaluated and treated and all questions answered.  Plantar Fasciitis, bilaterally -  XR reviewed as above.  - Educated on icing and stretching. Instructions given.  - second Injection delivered to the plantar fascia as below. - DME: Plantar fascial brace dispensed to support the medial longitudinal arch of the foot and offload pressure from the heel and prevent arch collapse during weightbearing - Pharmacologic management: None  Procedure: Injection Tendon/Ligament Location: Bilateral plantar fascia at the glabrous junction; medial approach. Skin Prep: alcohol Injectate: 0.5 cc 0.5% marcaine plain, 0.5 cc of 1% Lidocaine , 0.5 cc kenalog  10. Disposition: Patient tolerated procedure well. Injection site dressed with a band-aid.  Pes planovalgus -I explained to patient the etiology of pes planovalgus and relationship with Planter fasciitis and various treatment options were discussed.  Given patient foot structure in the setting of Planter fasciitis I believe patient will benefit from custom-made orthotics to help control the hindfoot motion support the arch of the foot and take the stress away from plantar fascial.  Patient agrees with the plan like to proceed with orthotics -Patient was casted for orthotics  Hallux abductovalgus deformity, Right -XR as above. -Patient has failed all conservative therapy and wishes to proceed with surgical intervention. All risks, benefits, and alternatives discussed with patient. No guarantees given. Consent reviewed and signed by patient. Post-op course explained at length. -Planned procedures: Right foot metatarsal osteotomy with possible phalangeal osteotomy with fixation with minimally invasive bunion surgery -Risk factors: None - I discussed my preoperative intra postop plan with the patient in extensive detail given the presence of bunion pain without any improvement from shoe gear modification patient will benefit from surgical intervention of the bunion deformity patient agrees with plan elected proceed with the surgery listed  above -Informed surgical risk consent was reviewed and read aloud to the patient.  I reviewed the films.  I have discussed my findings with the patient in great detail.  I have discussed all risks including but not limited to infection, stiffness, scarring, limp, disability, deformity, damage to blood vessels and nerves, numbness, poor healing, need for braces, arthritis, chronic pain, amputation, death.  All benefits and realistic expectations discussed in great detail.  I have made no promises as to the outcome.  I have provided realistic expectations.  I have offered the patient a 2nd opinion, which they have declined and assured me they preferred to proceed despite the risks "

## 2024-12-10 NOTE — Patient Instructions (Signed)

## 2024-12-24 ENCOUNTER — Emergency Department: Admission: EM | Admit: 2024-12-24 | Discharge: 2024-12-24 | Disposition: A | Discharge status: Home / Self Care

## 2024-12-24 ENCOUNTER — Other Ambulatory Visit: Payer: Self-pay

## 2024-12-24 DIAGNOSIS — N939 Abnormal uterine and vaginal bleeding, unspecified: Secondary | ICD-10-CM | POA: Insufficient documentation

## 2024-12-24 LAB — URINALYSIS, ROUTINE W REFLEX MICROSCOPIC
Bacteria, UA: NONE SEEN
Bilirubin Urine: NEGATIVE
Glucose, UA: NEGATIVE mg/dL
Ketones, ur: NEGATIVE mg/dL
Leukocytes,Ua: NEGATIVE
Nitrite: NEGATIVE
Protein, ur: NEGATIVE mg/dL
Specific Gravity, Urine: 1.02 (ref 1.005–1.030)
pH: 6 (ref 5.0–8.0)

## 2024-12-24 LAB — PREGNANCY, URINE: Preg Test, Ur: NEGATIVE

## 2024-12-24 MED ORDER — MEDROXYPROGESTERONE ACETATE 10 MG PO TABS: 10.0000 mg | ORAL_TABLET | Freq: Three times a day (TID) | ORAL | 0 refills | Status: AC

## 2024-12-24 NOTE — ED Notes (Signed)
 Lab called again after failed attempt to get blood by this tech

## 2024-12-24 NOTE — ED Triage Notes (Signed)
 Pt sts that she had an ablation surgery with a tubal ligation two yrs ago. Pt sts that she has been having vaginal bleeding for the last two weeks and it has not stopped. Pt sts that this is an ongoing problem.

## 2024-12-24 NOTE — ED Provider Notes (Signed)
 "   [facility] Emergency Department Provider Note     Event Date/Time   First MD Initiated Contact with Patient 12/24/24 1031     (approximate)   History   Vaginal Bleeding   HPI  Shelley Vargas is a 49 y.o. female with a history of obesity, iron deficiency anemia, eczema, menometrorrhagia, and bronchitis, presents to the ED for evaluation of abnormal vaginal bleeding.  Patient reports that had a uterine ablation performed some 2 years ago, for abnormal vaginal bleeding.  She would endorse that for the last 2 weeks, she has had persistent vaginal bleeding. Patient denies any recent GYN evaluation.  Physical Exam   Triage Vital Signs: ED Triage Vitals  Encounter Vitals Group     BP 12/24/24 0947 (!) 170/108     Girls Systolic BP Percentile --      Girls Diastolic BP Percentile --      Boys Systolic BP Percentile --      Boys Diastolic BP Percentile --      Pulse Rate 12/24/24 0947 74     Resp 12/24/24 0947 18     Temp 12/24/24 0947 98.1 F (36.7 C)     Temp Source 12/24/24 0947 Oral     SpO2 12/24/24 0947 100 %     Weight 12/24/24 0948 215 lb (97.5 kg)     Height 12/24/24 0948 5' 4 (1.626 m)     Head Circumference --      Peak Flow --      Pain Score 12/24/24 0948 10     Pain Loc --      Pain Education --      Exclude from Growth Chart --     Most recent vital signs: Vitals:   12/24/24 0947 12/24/24 1129  BP: (!) 170/108   Pulse: 74   Resp: 18   Temp: 98.1 F (36.7 C)   SpO2: 100% 100%    General Awake, no distress.  NAD HEENT NCAT. PERRL. EOMI. No rhinorrhea. Mucous membranes are moist.  CV:  Good peripheral perfusion.  RRR RESP:  Normal effort.  ABD:  No distention.  GYN:  deferred   ED Results / Procedures / Treatments   Labs (all labs ordered are listed, but only abnormal results are displayed) Labs Reviewed  URINALYSIS, ROUTINE W REFLEX MICROSCOPIC - Abnormal; Notable for the following components:      Result Value    Color, Urine YELLOW (*)    APPearance CLEAR (*)    Hgb urine dipstick MODERATE (*)    All other components within normal limits  PREGNANCY, URINE    EKG   RADIOLOGY  No results found.   PROCEDURES:  Critical Care performed: No  Procedures   MEDICATIONS ORDERED IN ED: Medications - No data to display   IMPRESSION / MDM / ASSESSMENT AND PLAN / ED COURSE  I reviewed the triage vital signs and the nursing notes.                              Differential diagnosis includes, but is not limited to, perimenopause, abnormal uterine bleeding, menstrual bleeding  Patient's presentation is most consistent with acute complicated illness / injury requiring diagnostic workup.  Patient's diagnosis is consistent with abnormal uterine bleeding.  Patient with a history of abnormal uterine bleeding, status post ablation, presents to the ED endorsing 2 weeks of vaginal bleeding that she believes is consistent with  her period, however longer than her typical.  She denies any fevers, chills, sweats.  No abnormal vaginal discharge is noted.  UA is negative for any acute infectious process.  Initial lab attempt is unsuccessful, and patient has declined a redraw at this time.  Lab review reveals she had a normal H&H about 3 months prior.  No clinical concern for acute or critical anemia, as patient is heme stable, and in no acute distress.  Patient likely experiencing anovulatory bleeding based on history.  Patient will be discharged home with prescriptions for progesterone. Patient is to follow up with her GYN provider as discussed, as needed or otherwise directed. Patient is given ED precautions to return to the ED for any worsening or new symptoms.   FINAL CLINICAL IMPRESSION(S) / ED DIAGNOSES   Final diagnoses:  Abnormal uterine bleeding     Rx / DC Orders   ED Discharge Orders          Ordered    medroxyPROGESTERone  (PROVERA ) 10 MG tablet  3 times daily        12/24/24 1136              Note:  This document was prepared using Dragon voice recognition software and may include unintentional dictation errors.    Loyd Candida LULLA Aldona, PA-C 12/24/24 1815  "

## 2024-12-24 NOTE — Discharge Instructions (Addendum)
 Your exam is normal and reassuring.  Symptoms may represent abnormal uterine bleeding.  We also be experiencing perimenopausal symptoms.  Follow-up with your GYN provider for ongoing evaluation.  Return to the ED if needed.

## 2024-12-24 NOTE — ED Notes (Signed)
 Lab called

## 2025-01-02 ENCOUNTER — Other Ambulatory Visit: Payer: Self-pay

## 2025-01-02 ENCOUNTER — Emergency Department
Admission: EM | Admit: 2025-01-02 | Discharge: 2025-01-02 | Disposition: A | Discharge status: Home / Self Care | Attending: Emergency Medicine | Admitting: Emergency Medicine

## 2025-01-02 ENCOUNTER — Emergency Department

## 2025-01-02 DIAGNOSIS — R0789 Other chest pain: Secondary | ICD-10-CM | POA: Insufficient documentation

## 2025-01-02 DIAGNOSIS — N939 Abnormal uterine and vaginal bleeding, unspecified: Secondary | ICD-10-CM | POA: Insufficient documentation

## 2025-01-02 LAB — BASIC METABOLIC PANEL WITH GFR
Anion gap: 9 (ref 5–15)
BUN: 8 mg/dL (ref 6–20)
CO2: 26 mmol/L (ref 22–32)
Calcium: 8.8 mg/dL — ABNORMAL LOW (ref 8.9–10.3)
Chloride: 104 mmol/L (ref 98–111)
Creatinine, Ser: 0.79 mg/dL (ref 0.44–1.00)
GFR, Estimated: >60 mL/min
Glucose, Bld: 91 mg/dL (ref 70–99)
Potassium: 3.5 mmol/L (ref 3.5–5.1)
Sodium: 138 mmol/L (ref 135–145)

## 2025-01-02 LAB — CBC
HCT: 39.2 % (ref 36.0–46.0)
Hemoglobin: 12.7 g/dL (ref 12.0–15.0)
MCH: 27.9 pg (ref 26.0–34.0)
MCHC: 32.4 g/dL (ref 30.0–36.0)
MCV: 86 fL (ref 80.0–100.0)
Platelets: 253 10*3/uL (ref 150–400)
RBC: 4.56 MIL/uL (ref 3.87–5.11)
RDW: 12.9 % (ref 11.5–15.5)
WBC: 6.4 10*3/uL (ref 4.0–10.5)
nRBC: 0 % (ref 0.0–0.2)

## 2025-01-02 LAB — TROPONIN T, HIGH SENSITIVITY: Troponin T High Sensitivity: <6 ng/L (ref 0–19)

## 2025-01-02 LAB — POC URINE PREG, ED: Preg Test, Ur: NEGATIVE

## 2025-01-02 MED ORDER — FERROUS SULFATE 325 (65 FE) MG PO TBEC: 325.0000 mg | DELAYED_RELEASE_TABLET | Freq: Every day | ORAL | 0 refills | Status: AC

## 2025-01-02 MED ORDER — FERROUS SULFATE 325 (65 FE) MG PO TBEC: 325.0000 mg | DELAYED_RELEASE_TABLET | Freq: Every day | ORAL | 0 refills | Status: DC

## 2025-01-02 MED ORDER — NAPROXEN 250 MG PO TABS: 500.0000 mg | ORAL_TABLET | Freq: Two times a day (BID) | ORAL | 0 refills | Status: DC

## 2025-01-02 MED ORDER — NAPROXEN 250 MG PO TABS: 500.0000 mg | ORAL_TABLET | Freq: Two times a day (BID) | ORAL | 0 refills | Status: AC

## 2025-01-02 NOTE — ED Provider Notes (Signed)
 "  [facility] Provider Note    Event Date/Time   First MD Initiated Contact with Patient 01/02/25 1005     (approximate)   History   Chief Complaint Vaginal Bleeding and Chest Pain   HPI  Shelley Vargas is a 49 y.o. female with past medical history of iron deficiency anemia and eczema who presents to the ED complaining of vaginal bleeding.  Patient reports that she has had about 3 weeks of consistent vaginal bleeding, causing her to have to change a pad 3-4 times per day.  She reports having to have an ablation for similar bleeding 3 years ago, but has not been seen by her OB/GYN for a couple of years.  She denies passing anything other than blood and has not had any pain in her abdomen.  She does report developing tightness in her chest and pain in her upper back yesterday, denies any associated fever, cough, or difficulty breathing.     Physical Exam   Triage Vital Signs: ED Triage Vitals  Encounter Vitals Group     BP 01/02/25 0935 (!) 149/81     Girls Systolic BP Percentile --      Girls Diastolic BP Percentile --      Boys Systolic BP Percentile --      Boys Diastolic BP Percentile --      Pulse Rate 01/02/25 0935 76     Resp 01/02/25 0935 18     Temp 01/02/25 0935 98 F (36.7 C)     Temp src --      SpO2 01/02/25 0935 96 %     Weight 01/02/25 0932 210 lb (95.3 kg)     Height 01/02/25 0932 5' 4 (1.626 m)     Head Circumference --      Peak Flow --      Pain Score 01/02/25 0932 7     Pain Loc --      Pain Education --      Exclude from Growth Chart --     Most recent vital signs: Vitals:   01/02/25 0935  BP: (!) 149/81  Pulse: 76  Resp: 18  Temp: 98 F (36.7 C)  SpO2: 96%    Constitutional: Alert and oriented. Eyes: Conjunctivae are normal. Head: Atraumatic. Nose: No congestion/rhinnorhea. Mouth/Throat: Mucous membranes are moist.  Cardiovascular: Normal rate, regular rhythm. Grossly normal heart sounds.  2+ radial pulses  bilaterally. Respiratory: Normal respiratory effort.  No retractions. Lungs CTAB. Gastrointestinal: Soft and nontender. No distention. Musculoskeletal: No lower extremity tenderness nor edema.  Tenderness to palpation noted of thoracic paraspinal areas bilaterally.  No midline thoracic spinal tenderness. Neurologic:  Normal speech and language. No gross focal neurologic deficits are appreciated.    ED Results / Procedures / Treatments   Labs (all labs ordered are listed, but only abnormal results are displayed) Labs Reviewed  BASIC METABOLIC PANEL WITH GFR - Abnormal; Notable for the following components:      Result Value   Calcium 8.8 (*)    All other components within normal limits  CBC  POC URINE PREG, ED  TROPONIN T, HIGH SENSITIVITY     EKG  ED ECG REPORT I, Carlin Palin, the attending physician, personally viewed and interpreted this ECG.   Date: 01/02/2025  EKG Time: 9:33  Rate: 82  Rhythm: normal sinus rhythm  Axis: Normal  Intervals:none  ST&T Change: None  RADIOLOGY Chest x-ray reviewed and interpreted by me with no infiltrate, edema, or effusion.  PROCEDURES:  Critical Care performed: No  Procedures   MEDICATIONS ORDERED IN ED: Medications - No data to display   IMPRESSION / MDM / ASSESSMENT AND PLAN / ED COURSE  I reviewed the triage vital signs and the nursing notes.                              49 y.o. female with past medical history of IDA and eczema who presents to the ED complaining of persistent vaginal bleeding for the past 3 weeks and chest tightness as well as upper back pain since yesterday.  Patient's presentation is most consistent with acute presentation with potential threat to life or bodily function.  Differential diagnosis includes, but is not limited to, ACS, PE, pneumonia, pneumothorax, musculoskeletal pain, GERD, anxiety, anemia, ectopic pregnancy, postmenopausal bleeding.  Patient nontoxic-appearing and in no acute  distress, vital signs are unremarkable.  Patient reports that she has not had a regular period since her ablation a few years ago, unclear if she is postmenopausal.  Pregnancy testing is pending but she has a benign abdominal exam and labs without significant anemia, hemoglobin stable compared to previous.  Chest pain seems atypical for ACS, EKG without evidence of arrhythmia or ischemia and troponin within normal limits.  Additional labs without significant electrolyte abnormality or AKI, chest x-ray negative for acute finding.  Upper back pain is reproducible and suspect musculoskeletal etiology, no vital sign abnormality to suggest PE.  Pregnancy testing is negative and patient appropriate for outpatient management with OB/GYN follow-up.  We will start course of NSAIDs and iron supplementation to help with bleeding, she was otherwise counseled to return to the ED for new or worsening symptoms.  She does appear to have an appointment with OB/GYN in 5 days, counseled to return to the ED for new or worsening symptoms in the meantime.  Patient agrees with plan.      FINAL CLINICAL IMPRESSION(S) / ED DIAGNOSES   Final diagnoses:  Abnormal vaginal bleeding  Atypical chest pain     Rx / DC Orders   ED Discharge Orders          Ordered    naproxen  (NAPROSYN ) 250 MG tablet  2 times daily with meals        01/02/25 1052    ferrous sulfate  325 (65 FE) MG EC tablet  Daily with breakfast        01/02/25 1052             Note:  This document was prepared using Dragon voice recognition software and may include unintentional dictation errors.   Willo Dunnings, MD 01/02/25 1053  "

## 2025-01-02 NOTE — ED Triage Notes (Signed)
 Pt via POV from home. Pt has multiple complaints including vaginal bleeding for the past 3 weeks, mid-upper back pain. States also yesterday she has been having some chest pain. Denies any cardiac hx. Denies any dysuria or vaginal d/c. Pt is A&Ox4 and NAD, ambulatory to triage.
